# Patient Record
Sex: Male | Born: 1955 | Race: White | Hispanic: No | Marital: Married | Smoking: Never smoker
Health system: Southern US, Community
[De-identification: ages and names within clinical notes are randomized; demographics above are authoritative.]

## PROBLEM LIST (undated history)

## (undated) DIAGNOSIS — E669 Obesity, unspecified: Secondary | ICD-10-CM

## (undated) DIAGNOSIS — C4490 Unspecified malignant neoplasm of skin, unspecified: Secondary | ICD-10-CM

## (undated) DIAGNOSIS — Z8719 Personal history of other diseases of the digestive system: Secondary | ICD-10-CM

## (undated) DIAGNOSIS — Z8711 Personal history of peptic ulcer disease: Secondary | ICD-10-CM

## (undated) DIAGNOSIS — G8929 Other chronic pain: Secondary | ICD-10-CM

## (undated) DIAGNOSIS — K219 Gastro-esophageal reflux disease without esophagitis: Secondary | ICD-10-CM

## (undated) DIAGNOSIS — IMO0001 Reserved for inherently not codable concepts without codable children: Secondary | ICD-10-CM

## (undated) DIAGNOSIS — I10 Essential (primary) hypertension: Secondary | ICD-10-CM

## (undated) DIAGNOSIS — G4730 Sleep apnea, unspecified: Secondary | ICD-10-CM

## (undated) DIAGNOSIS — M1990 Unspecified osteoarthritis, unspecified site: Secondary | ICD-10-CM

## (undated) DIAGNOSIS — M25579 Pain in unspecified ankle and joints of unspecified foot: Secondary | ICD-10-CM

## (undated) DIAGNOSIS — Z9889 Other specified postprocedural states: Secondary | ICD-10-CM

## (undated) HISTORY — DX: Other chronic pain: G89.29

## (undated) HISTORY — DX: Sleep apnea, unspecified: G47.30

## (undated) HISTORY — DX: Reserved for inherently not codable concepts without codable children: IMO0001

## (undated) HISTORY — DX: Personal history of other diseases of the digestive system: Z87.19

## (undated) HISTORY — PX: ABSCESS DRAINAGE: SHX1119

## (undated) HISTORY — DX: Pain in unspecified ankle and joints of unspecified foot: M25.579

## (undated) HISTORY — DX: Gastro-esophageal reflux disease without esophagitis: K21.9

## (undated) HISTORY — DX: Unspecified malignant neoplasm of skin, unspecified: C44.90

## (undated) HISTORY — DX: Obesity, unspecified: E66.9

## (undated) HISTORY — DX: Unspecified osteoarthritis, unspecified site: M19.90

## (undated) HISTORY — DX: Personal history of peptic ulcer disease: Z87.11

---

## 2005-07-12 ENCOUNTER — Ambulatory Visit: Payer: Self-pay | Admitting: Unknown Physician Specialty

## 2008-08-24 ENCOUNTER — Ambulatory Visit: Payer: Self-pay | Admitting: Internal Medicine

## 2014-01-10 ENCOUNTER — Ambulatory Visit: Payer: Self-pay | Admitting: Unknown Physician Specialty

## 2014-10-12 ENCOUNTER — Ambulatory Visit: Payer: Self-pay | Admitting: Internal Medicine

## 2014-11-22 ENCOUNTER — Ambulatory Visit: Payer: Self-pay | Admitting: Internal Medicine

## 2015-09-21 DIAGNOSIS — C4491 Basal cell carcinoma of skin, unspecified: Secondary | ICD-10-CM

## 2015-09-21 HISTORY — DX: Basal cell carcinoma of skin, unspecified: C44.91

## 2015-10-10 DIAGNOSIS — C439 Malignant melanoma of skin, unspecified: Secondary | ICD-10-CM

## 2015-10-10 HISTORY — DX: Malignant melanoma of skin, unspecified: C43.9

## 2015-11-03 DIAGNOSIS — Z8042 Family history of malignant neoplasm of prostate: Secondary | ICD-10-CM | POA: Insufficient documentation

## 2015-11-21 ENCOUNTER — Ambulatory Visit: Payer: Self-pay | Admitting: Obstetrics and Gynecology

## 2015-11-30 ENCOUNTER — Encounter: Payer: Self-pay | Admitting: *Deleted

## 2015-11-30 DIAGNOSIS — M25579 Pain in unspecified ankle and joints of unspecified foot: Secondary | ICD-10-CM | POA: Insufficient documentation

## 2015-11-30 DIAGNOSIS — M1990 Unspecified osteoarthritis, unspecified site: Secondary | ICD-10-CM | POA: Insufficient documentation

## 2015-11-30 DIAGNOSIS — M25569 Pain in unspecified knee: Secondary | ICD-10-CM | POA: Insufficient documentation

## 2015-11-30 DIAGNOSIS — Z800 Family history of malignant neoplasm of digestive organs: Secondary | ICD-10-CM | POA: Insufficient documentation

## 2015-12-01 ENCOUNTER — Encounter: Payer: Self-pay | Admitting: Obstetrics and Gynecology

## 2015-12-01 ENCOUNTER — Ambulatory Visit (INDEPENDENT_AMBULATORY_CARE_PROVIDER_SITE_OTHER): Payer: BC Managed Care – PPO | Admitting: Obstetrics and Gynecology

## 2015-12-01 VITALS — BP 155/94 | HR 71 | Resp 16 | Ht 66.0 in | Wt 199.1 lb

## 2015-12-01 DIAGNOSIS — E291 Testicular hypofunction: Secondary | ICD-10-CM

## 2015-12-01 DIAGNOSIS — R5383 Other fatigue: Secondary | ICD-10-CM | POA: Diagnosis not present

## 2015-12-01 DIAGNOSIS — R7989 Other specified abnormal findings of blood chemistry: Secondary | ICD-10-CM

## 2015-12-01 NOTE — Progress Notes (Signed)
12/01/2015 4:05 PM   Shawn Melton Apr 05, 1956 [id number]  Referring provider: No referring provider defined for this encounter.  Chief Complaint  Patient presents with  . Hypogonadism  . Establish Care    HPI: Patient is a 59-year-old Caucasian male presenting today as a referral from his primary care provider with complaints of low serum testosterone level. Symptoms include fatigue, irritability and weight gain of approximately 10 lbs in the last year. He states that he has been under increasing stress over the last year. He recently on a new house in as having to relocate as well as continued long distances for work. His testosterone drawn last year was within normal limits at 465.   No decrease in testicular size.  No headaches or gynecomastia.  No new medications.    Testosterone 09/30/14 T 465 11/03/15   T 307 drawn @ 1031  11/03/15 TSH 1.288  CBC WNL  PSA History: 11/03/15    PSA 2.55 09/30/14  PSA 3.52  PMH: Past Medical History  Diagnosis Date  . Chronic ankle pain   . DJD (degenerative joint disease)   . Obesity   . Skin cancer   . Reflux   . Sleep apnea   . History of stomach ulcers     Surgical History: Past Surgical History  Procedure Laterality Date  . Abscess drainage      Home Medications:    Medication List       This list is accurate as of: 12/01/15  4:05 PM.  Always use your most recent med list.               acyclovir 400 MG tablet  Commonly known as:  ZOVIRAX  Reported on 12/01/2015     ALPRAZolam 0.5 MG tablet  Commonly known as:  XANAX  Take by mouth. Reported on 12/01/2015     DENAVIR 1 % cream  Generic drug:  penciclovir  Reported on 12/01/2015     omeprazole 40 MG capsule  Commonly known as:  PRILOSEC  TAKE ONE CAPSULE BY MOUTH EVERY DAY     ONE-A-DAY MENS 50+ ADVANTAGE Tabs  Take by mouth.        Allergies: No Known Allergies  Family History: Family History  Problem Relation Age of Onset  . Hypertension Mother    . Diabetes Father   . Stroke Father   . Colon cancer Paternal Uncle   . Prostate cancer Cousin     Social History:  reports that he has never smoked. He does not have any smokeless tobacco history on file. He reports that he does not drink alcohol or use illicit drugs.  ROS: UROLOGY Frequent Urination?: No Hard to postpone urination?: No Burning/pain with urination?: No Get up at night to urinate?: No Leakage of urine?: No Urine stream starts and stops?: No Trouble starting stream?: No Do you have to strain to urinate?: No Blood in urine?: No Urinary tract infection?: No Sexually transmitted disease?: No Injury to kidneys or bladder?: No Painful intercourse?: No Weak stream?: No Erection problems?: No Penile pain?: No  Gastrointestinal Nausea?: No Vomiting?: No Indigestion/heartburn?: No Diarrhea?: No Constipation?: No  Constitutional Fever: No Night sweats?: No Weight loss?: No Fatigue?: Yes  Skin Skin rash/lesions?: No Itching?: No  Eyes Blurred vision?: No Double vision?: No  Ears/Nose/Throat Sore throat?: No Sinus problems?: No  Hematologic/Lymphatic Swollen glands?: No Easy bruising?: No  Cardiovascular Leg swelling?: No Chest pain?: No  Respiratory Cough?: No Shortness of breath?: No  Endocrine Excessive thirst?: No  Musculoskeletal Back pain?: No Joint pain?: Yes  Neurological Headaches?: No Dizziness?: No  Psychologic Depression?: No Anxiety?: No  Physical Exam: BP 155/94 mmHg  Pulse 71  Resp 16  Ht 5\' 6"  (1.676 m)  Wt 199 lb 1.6 oz (90.311 kg)  BMI 32.15 kg/m2  Constitutional:  Alert and oriented, No acute distress. HEENT: [place] AT, moist mucus membranes.  Trachea midline, no masses. Cardiovascular: No clubbing, cyanosis, or edema. Respiratory: Normal respiratory effort, no increased work of breathing. GI: Abdomen is soft, nontender, nondistended, no abdominal masses GU: No CVA tenderness.  Normal circumcised phallus  without lesions. Testicles descended bilaterally without palpable masses or tenderness DRE: Prostate normal in size, smooth, no nodularity, nontender Skin: No rashes, bruises or suspicious lesions. Lymph: No cervical or inguinal adenopathy. Neurologic: Grossly intact, no focal deficits, moving all 4 extremities. Psychiatric: Normal mood and affect.  Laboratory Data:   Assessment & Plan:    1. Fatigue- Will obtain 2 additional early a.m. testosterone levels for further evaluation. We will also obtain a Vitamin D level.  RTC in 3 weeks to review results. -Testosterone x2 early am - Vitamin D -LH/FSH -prolactin  2. Low Serum testosterone-   There are no diagnoses linked to this encounter.  Return for 1 week early am lab draw; 2 week early am lab draw; f/u with me in 3 weeks to review labs.  These notes generated with voice recognition software. I apologize for typographical errors.  Herbert Moors, [facility] [address] [place], [place] [address] [phone]

## 2015-12-07 ENCOUNTER — Other Ambulatory Visit: Payer: BC Managed Care – PPO

## 2015-12-07 DIAGNOSIS — R7989 Other specified abnormal findings of blood chemistry: Secondary | ICD-10-CM

## 2015-12-07 DIAGNOSIS — R5383 Other fatigue: Secondary | ICD-10-CM

## 2015-12-07 NOTE — Progress Notes (Signed)
Lab tech was not able to draw enough blood to run all 4 test. Therefore only testosterone and FSH/LH were sent today. New orders for vitamin D and prolactin were put back in.

## 2015-12-08 LAB — FSH/LH
FSH: 4.9 m[IU]/mL (ref 1.5–12.4)
LH: 4.4 m[IU]/mL (ref 1.7–8.6)

## 2015-12-08 LAB — TESTOSTERONE: Testosterone: 236 ng/dL — ABNORMAL LOW (ref 348–1197)

## 2015-12-15 ENCOUNTER — Other Ambulatory Visit: Payer: BC Managed Care – PPO

## 2015-12-15 DIAGNOSIS — R5383 Other fatigue: Secondary | ICD-10-CM

## 2015-12-16 LAB — PROLACTIN: Prolactin: 20.1 ng/mL — ABNORMAL HIGH (ref 4.0–15.2)

## 2015-12-16 LAB — VITAMIN D 25 HYDROXY (VIT D DEFICIENCY, FRACTURES): Vit D, 25-Hydroxy: 46.6 ng/mL (ref 30.0–100.0)

## 2015-12-22 ENCOUNTER — Ambulatory Visit (INDEPENDENT_AMBULATORY_CARE_PROVIDER_SITE_OTHER): Payer: BC Managed Care – PPO | Admitting: Obstetrics and Gynecology

## 2015-12-22 ENCOUNTER — Encounter: Payer: Self-pay | Admitting: Obstetrics and Gynecology

## 2015-12-22 VITALS — BP 141/91 | HR 68 | Resp 16 | Ht 66.0 in | Wt 201.9 lb

## 2015-12-22 DIAGNOSIS — R7989 Other specified abnormal findings of blood chemistry: Secondary | ICD-10-CM

## 2015-12-22 DIAGNOSIS — R5383 Other fatigue: Secondary | ICD-10-CM

## 2015-12-22 DIAGNOSIS — E291 Testicular hypofunction: Secondary | ICD-10-CM

## 2015-12-22 DIAGNOSIS — E229 Hyperfunction of pituitary gland, unspecified: Secondary | ICD-10-CM

## 2015-12-22 NOTE — Progress Notes (Signed)
1:46 PM   Shawn Melton 1956-09-20 [id number]  Referring provider: Madelyn Brunner, MD [facility] [place], [place] [address]  Chief Complaint  Patient presents with  . Hypogonadism    HPI: Patient is a 60-year-old Caucasian male presenting today as a referral from his primary care provider with complaints of low serum testosterone level. Symptoms include fatigue, irritability and weight gain of approximately 10 lbs in the last year. He states that he has been under increasing stress over the last year. He recently on a new house in as having to relocate as well as continued long distances for work. His testosterone drawn last year was within normal limits at 465.   No decrease in testicular size.  No headaches or gynecomastia.  No new medications.    Testosterone 09/30/14 T 465 11/03/15   T 307 drawn @ 1031  11/03/15 TSH 1.288  CBC WNL  PSA History: 11/03/15    PSA 2.55 09/30/14  PSA 3.52  Current Status:  PMH: Past Medical History  Diagnosis Date  . Chronic ankle pain   . DJD (degenerative joint disease)   . Obesity   . Skin cancer   . Reflux   . Sleep apnea   . History of stomach ulcers     Surgical History: Past Surgical History  Procedure Laterality Date  . Abscess drainage      Home Medications:    Medication List       This list is accurate as of: 12/22/15  1:46 PM.  Always use your most recent med list.               acyclovir 400 MG tablet  Commonly known as:  ZOVIRAX  Reported on 12/01/2015     DENAVIR 1 % cream  Generic drug:  penciclovir  Reported on 12/01/2015     omeprazole 40 MG capsule  Commonly known as:  PRILOSEC  TAKE ONE CAPSULE BY MOUTH EVERY DAY     ONE-A-DAY MENS 50+ ADVANTAGE Tabs  Take by mouth.        Allergies: No Known Allergies  Family History: Family History  Problem Relation Age of Onset  . Hypertension Mother   . Diabetes Father   . Stroke Father   . Colon cancer Paternal  Uncle   . Prostate cancer Cousin     Social History:  reports that he has never smoked. He does not have any smokeless tobacco history on file. He reports that he does not drink alcohol or use illicit drugs.  ROS: UROLOGY Frequent Urination?: No Hard to postpone urination?: No Burning/pain with urination?: No Get up at night to urinate?: No Leakage of urine?: No Urine stream starts and stops?: No Trouble starting stream?: No Do you have to strain to urinate?: No Blood in urine?: No Urinary tract infection?: No Sexually transmitted disease?: No Injury to kidneys or bladder?: No Painful intercourse?: No Weak stream?: No Erection problems?: No Penile pain?: No  Gastrointestinal Nausea?: No Vomiting?: No Indigestion/heartburn?: No Diarrhea?: No Constipation?: No  Constitutional Fever: No Night sweats?: No Weight loss?: No Fatigue?: No  Skin Skin rash/lesions?: No Itching?: No  Eyes Blurred vision?: No Double vision?: No  Ears/Nose/Throat Sore throat?: No Sinus problems?: No  Hematologic/Lymphatic Swollen glands?: No Easy bruising?: No  Cardiovascular Leg swelling?: No Chest pain?: No  Respiratory Cough?: No Shortness of breath?: No  Endocrine Excessive thirst?: No  Musculoskeletal Back pain?: No Joint pain?: No  Neurological Headaches?:  No Dizziness?: No  Psychologic Depression?: No Anxiety?: No  Physical Exam: BP 141/91 mmHg  Pulse 68  Resp 16  Ht 5\' 6"  (1.676 m)  Wt 201 lb 14.4 oz (91.581 kg)  BMI 32.60 kg/m2  Constitutional:  Alert and oriented, No acute distress. HEENT: [place] AT, moist mucus membranes.  Trachea midline, no masses. Cardiovascular: No clubbing, cyanosis, or edema. Respiratory: Normal respiratory effort, no increased work of breathing. Skin: No rashes, bruises or suspicious lesions. Neurologic: Grossly intact, no focal deficits, moving all 4 extremities. Psychiatric: Normal mood and affect.  Laboratory  Data: Results for orders placed or performed in visit on 12/15/15  Prolactin  Result Value Ref Range   Prolactin 20.1 (H) 4.0 - 15.2 ng/mL  Vit D  25 hydroxy (rtn osteoporosis monitoring)  Result Value Ref Range   Vit D, 25-Hydroxy 46.6 30.0 - 100.0 ng/mL    Assessment & Plan:    1. Fatigue- testosterone level low 1. Vitamin D low normal. FSH and LH normal. Prolactin was slightly elevated. -Testosterone 236 - Vitamin D- 46.6- Recommended OTC supplements. -LH/FSH- 4.4/4.9 WNL -prolactin 20.1  2. Low Serum testosterone-  With elevated prolactin and normal LH/FSH. Refer to Endocrinology for further workup. We will consider testosterone replacement in the future pending completion of endocrinology workup.  3. Elevated Prolactin level- see above   There are no diagnoses linked to this encounter.  Return for patient will call for f/u appt after he sees endocrinologist .  These notes generated with voice recognition software. I apologize for typographical errors.  Herbert Moors, [facility] [address], [place], [place] [address] [phone]

## 2016-01-05 DIAGNOSIS — Z8719 Personal history of other diseases of the digestive system: Secondary | ICD-10-CM | POA: Insufficient documentation

## 2016-01-05 DIAGNOSIS — R195 Other fecal abnormalities: Secondary | ICD-10-CM | POA: Insufficient documentation

## 2016-01-05 DIAGNOSIS — K625 Hemorrhage of anus and rectum: Secondary | ICD-10-CM | POA: Insufficient documentation

## 2017-07-07 DIAGNOSIS — Z85828 Personal history of other malignant neoplasm of skin: Secondary | ICD-10-CM | POA: Insufficient documentation

## 2017-10-02 ENCOUNTER — Encounter: Payer: Self-pay | Admitting: Internal Medicine

## 2017-12-10 DIAGNOSIS — K219 Gastro-esophageal reflux disease without esophagitis: Secondary | ICD-10-CM | POA: Insufficient documentation

## 2018-07-24 ENCOUNTER — Encounter: Payer: Self-pay | Admitting: Urology

## 2018-07-24 ENCOUNTER — Ambulatory Visit: Payer: BC Managed Care – PPO | Admitting: Urology

## 2018-07-24 VITALS — BP 181/95 | HR 88 | Ht 66.0 in | Wt 195.0 lb

## 2018-07-24 DIAGNOSIS — R972 Elevated prostate specific antigen [PSA]: Secondary | ICD-10-CM | POA: Diagnosis not present

## 2018-07-24 LAB — URINALYSIS, COMPLETE
Bilirubin, UA: NEGATIVE
Glucose, UA: NEGATIVE
Ketones, UA: NEGATIVE
Leukocytes, UA: NEGATIVE
Nitrite, UA: NEGATIVE
Protein, UA: NEGATIVE
RBC, UA: NEGATIVE
Specific Gravity, UA: 1.025 (ref 1.005–1.030)
Urobilinogen, Ur: 0.2 mg/dL (ref 0.2–1.0)
pH, UA: 6 (ref 5.0–7.5)

## 2018-07-24 NOTE — Patient Instructions (Signed)

## 2018-07-24 NOTE — Progress Notes (Signed)
07/24/2018 1:29 PM   Shawn Melton [birth year] [id number]  Referring provider: Madelyn Brunner, MD No address on file  Chief Complaint  Patient presents with  . Elevated PSA    HPI: 62 year old male previously known to our practice for evaluation of low energy who returns today for further evaluation of elevated and rising PSA.  He has not been seen in our practice since early 2017.    He had annual PSAs by his primary care physician.  More recently, his PSA is risen up to 6.25 as of 06/08/2018.  PSA trend below.  PSA trend:  3.52 09/30/2014 2.55 11/03/2015 4.55 12/11/2017 6.25  06/08/2018  He has does have occasional urgency/ frequency but otherwise few urinary symptoms.  No dysuria or gross hematuria.    Cousin with prostate cancer, dx at 86.  No other close relatives.    He denies ED or sexual symptoms.    IPSS    Row Name 07/24/18 0900         International Prostate Symptom Score   How often have you had the sensation of not emptying your bladder?  Not at All     How often have you had to urinate less than every two hours?  Less than half the time     How often have you found you stopped and started again several times when you urinated?  Not at All     How often have you found it difficult to postpone urination?  Not at All     How often have you had a weak urinary stream?  Less than 1 in 5 times     How often have you had to strain to start urination?  Not at All     How many times did you typically get up at night to urinate?  None     Total IPSS Score  3       Quality of Life due to urinary symptoms   If you were to spend the rest of your life with your urinary condition just the way it is now how would you feel about that?  Mixed        Score:  1-7 Mild 8-19 Moderate 20-35 Severe   PMH: Past Medical History:  Diagnosis Date  . Chronic ankle pain   . DJD (degenerative joint disease)   . History of stomach ulcers   . Obesity   . Reflux   . Skin  cancer   . Sleep apnea     Surgical History: Past Surgical History:  Procedure Laterality Date  . ABSCESS DRAINAGE      Home Medications:  Allergies as of 07/24/2018   No Known Allergies     Medication List        Accurate as of 07/24/18  1:29 PM. Always use your most recent med list.          acyclovir 400 MG tablet Commonly known as:  ZOVIRAX Reported on 12/01/2015   ALPRAZolam 0.5 MG tablet Commonly known as:  XANAX TAKE 1 TABLET BY MOUTH NIGHTLY AS NEEDED FOR SLEEP   DENAVIR 1 % cream Generic drug:  penciclovir Reported on 12/01/2015   omeprazole 40 MG capsule Commonly known as:  PRILOSEC TAKE ONE CAPSULE BY MOUTH EVERY DAY   ONE-A-DAY MENS 50+ ADVANTAGE Tabs Take by mouth.       Allergies: No Known Allergies  Family History: Family History  Problem Relation Age of Onset  . Diabetes  Father   . Stroke Father   . Hypertension Mother   . Colon cancer Paternal Uncle   . Prostate cancer Cousin     Social History:  reports that he has never smoked. He has never used smokeless tobacco. He reports that he does not drink alcohol or use drugs.  ROS: UROLOGY Frequent Urination?: No Hard to postpone urination?: No Burning/pain with urination?: No Get up at night to urinate?: No Leakage of urine?: No Urine stream starts and stops?: No Trouble starting stream?: No Do you have to strain to urinate?: No Blood in urine?: No Urinary tract infection?: No Sexually transmitted disease?: No Injury to kidneys or bladder?: No Painful intercourse?: No Weak stream?: No Erection problems?: No Penile pain?: No  Gastrointestinal Nausea?: No Vomiting?: No Indigestion/heartburn?: No Diarrhea?: No Constipation?: No  Constitutional Fever: No Night sweats?: No Weight loss?: No Fatigue?: No  Skin Skin rash/lesions?: No Itching?: No  Eyes Blurred vision?: No Double vision?: No  Ears/Nose/Throat Sore throat?: No Sinus problems?:  No  Hematologic/Lymphatic Swollen glands?: No Easy bruising?: No  Cardiovascular Leg swelling?: No Chest pain?: No  Respiratory Cough?: No Shortness of breath?: No  Endocrine Excessive thirst?: No  Musculoskeletal Back pain?: No Joint pain?: No  Neurological Headaches?: No Dizziness?: No  Psychologic Depression?: No Anxiety?: No  Physical Exam: BP (!) 181/95   Pulse 88   Ht 5\' 6"  (1.676 m)   Wt 195 lb (88.5 kg)   BMI 31.47 kg/m   Constitutional:  Alert and oriented, No acute distress. HEENT: [place] AT, moist mucus membranes.  Trachea midline, no masses. Cardiovascular: No clubbing, cyanosis, or edema. Respiratory: Normal respiratory effort, no increased work of breathing. GI: Abdomen is soft, nontender, nondistended, no abdominal masses GU: No CVA tenderness Rectal exam: Normal sphincter tone.  40 cc prostate, nontender, no nodules. Skin: No rashes, bruises or suspicious lesions. Neurologic: Grossly intact, no focal deficits, moving all 4 extremities. Psychiatric: Normal mood and affect.  Laboratory Data:  Lab Results  Component Value Date   TESTOSTERONE 236 (L) 12/07/2015   PSA as above   Urinalysis Results for orders placed or performed in visit on 07/24/18  Urinalysis, Complete  Result Value Ref Range   Specific Gravity, UA 1.025 1.005 - 1.030   pH, UA 6.0 5.0 - 7.5   Color, UA Yellow Yellow   Appearance Ur Clear Clear   Leukocytes, UA Negative Negative   Protein, UA Negative Negative/Trace   Glucose, UA Negative Negative   Ketones, UA Negative Negative   RBC, UA Negative Negative   Bilirubin, UA Negative Negative   Urobilinogen, Ur 0.2 0.2 - 1.0 mg/dL   Nitrite, UA Negative Negative    Pertinent Imaging: N/A  Assessment & Plan:    1. Elevated PSA  We reviewed the implications of an elevated PSA and the uncertainty surrounding it. In general, a man's PSA increases with age and is produced by both normal and cancerous prostate tissue.  Differential for elevated PSA is BPH, prostate cancer, infection, recent intercourse/ejaculation, prostate infarction, recent urethroscopic manipulation (foley placement/cystoscopy) and prostatitis. Management of an elevated PSA can include observation or prostate biopsy and wediscussed this in detail. We discussed that indications for prostate biopsy are defined by age and race specific PSA cutoffs as well as a PSA velocity of 0.75/year.  Overall, his PSA is elevated and upward trending which is concerning.  Rectal exam unremarkable today.  Plan to repeat PSA today.  We discussed that if his PSA remains elevated  or is higher, we will recommend pursuing prostate biopsy.  We discussed prostate biopsy in detail including the procedure itself, the risks of blood in the urine, stool, and ejaculate, serious infection, and discomfort. He is willing to proceed with this as discussed.  - PSA   Return in about 4 weeks (around 08/21/2018) for prostate biopsy.  Hollice Espy, MD  [facility] [address], [place], [place] [address] [phone]

## 2018-07-25 LAB — PSA: Prostate Specific Ag, Serum: 4.8 ng/mL — ABNORMAL HIGH (ref 0.0–4.0)

## 2018-07-27 ENCOUNTER — Telehealth: Payer: Self-pay | Admitting: Urology

## 2018-07-27 NOTE — Telephone Encounter (Signed)
-----   Message from Hollice Espy, MD sent at 07/25/2018  3:06 PM EDT ----- PSA is actually back down to 4.8.  This is reassuring.  Lets arrange for follow-up in 6 months with PSA again and continue to follow you closely.  I recommend holding off on biopsy for the time being.  Hollice Espy, MD

## 2018-07-27 NOTE — Telephone Encounter (Signed)
Biopsy has been canceled and he has been scheduled for his 6 month appts.  Sharyn Lull

## 2018-08-13 ENCOUNTER — Other Ambulatory Visit: Payer: BC Managed Care – PPO | Admitting: Urology

## 2018-08-27 ENCOUNTER — Ambulatory Visit: Payer: BC Managed Care – PPO | Admitting: Urology

## 2018-12-02 DIAGNOSIS — C61 Malignant neoplasm of prostate: Secondary | ICD-10-CM

## 2018-12-02 HISTORY — DX: Malignant neoplasm of prostate: C61

## 2019-01-21 NOTE — Progress Notes (Signed)
01/27/2019 8:52 AM   Shawn Melton 10-Jun-1956 [id number]  Referring provider: Baxter Hire, MD [place], [place] [address]  Chief Complaint  Patient presents with  . Elevated PSA    6 month follow up    HPI: Shawn Melton is a 63 yo M who returns today for the evaluation and management of elevated and rising PSA. He was last seen by [place] on 07/24/2018.  He returns today with six-month follow-up PSA.  More recently, his PSA is risen up to 7.10 as of 01/25/2019.  PSA trend below.  PSA trend:  3.52 09/30/2014 2.55 11/03/2015 4.55 12/11/2017 6.25  06/08/2018 --> considered biopsy but canceled when repeat PSA trended down 4.80  07/24/2018  7.10  01/25/2019   He does not report of any bothersome urinary symptoms.  He denies use of aspirin. Sometimes takes ibuprofen.   Cousin with prostate cancer, dx at 37.  No other close relatives.    PMH: Past Medical History:  Diagnosis Date  . Chronic ankle pain   . DJD (degenerative joint disease)   . History of stomach ulcers   . Obesity   . Reflux   . Skin cancer   . Sleep apnea     Surgical History: Past Surgical History:  Procedure Laterality Date  . ABSCESS DRAINAGE      Home Medications:  Allergies as of 01/27/2019   No Known Allergies     Medication List       Accurate as of January 27, 2019  8:52 AM. Always use your most recent med list.        acyclovir 400 MG tablet Commonly known as:  ZOVIRAX Reported on 12/01/2015   ALPRAZolam 0.5 MG tablet Commonly known as:  XANAX TAKE 1 TABLET BY MOUTH NIGHTLY AS NEEDED FOR SLEEP   DENAVIR 1 % cream Generic drug:  penciclovir Reported on 12/01/2015   losartan 25 MG tablet Commonly known as:  COZAAR   omeprazole 40 MG capsule Commonly known as:  PRILOSEC TAKE ONE CAPSULE BY MOUTH EVERY DAY   ONE-A-DAY MENS 50+ ADVANTAGE Tabs Take by mouth.       Allergies: No Known Allergies  Family History: Family History  Problem Relation Age of  Onset  . Diabetes Father   . Stroke Father   . Hypertension Mother   . Colon cancer Paternal Uncle   . Prostate cancer Cousin     Social History:  reports that he has never smoked. He has never used smokeless tobacco. He reports that he does not drink alcohol or use drugs.  ROS: UROLOGY Frequent Urination?: No Hard to postpone urination?: No Burning/pain with urination?: No Get up at night to urinate?: No Leakage of urine?: No Urine stream starts and stops?: No Trouble starting stream?: No Do you have to strain to urinate?: No Blood in urine?: No Urinary tract infection?: No Sexually transmitted disease?: No Injury to kidneys or bladder?: No Painful intercourse?: No Weak stream?: No Erection problems?: No Penile pain?: No  Gastrointestinal Nausea?: No Vomiting?: No Indigestion/heartburn?: No Diarrhea?: No Constipation?: No  Constitutional Fever: No Night sweats?: No Weight loss?: No Fatigue?: No  Skin Skin rash/lesions?: No Itching?: No  Eyes Blurred vision?: No Double vision?: No  Ears/Nose/Throat Sore throat?: No Sinus problems?: No  Hematologic/Lymphatic Swollen glands?: No Easy bruising?: No  Cardiovascular Leg swelling?: No Chest pain?: No  Respiratory Cough?: No Shortness of breath?: No  Endocrine Excessive thirst?: No  Musculoskeletal Back pain?: No  Joint pain?: No  Neurological Headaches?: No Dizziness?: No  Psychologic Depression?: No Anxiety?: No  Physical Exam: BP (!) 167/86   Pulse 80   Ht 5\' 2"  (1.575 m)   Wt 207 lb (93.9 kg)   BMI 37.86 kg/m   Constitutional:  Alert and oriented, No acute distress. HEENT: [place] AT, moist mucus membranes.  Trachea midline, no masses. Cardiovascular: No clubbing, cyanosis, or edema. Respiratory: Normal respiratory effort, no increased work of breathing. Skin: No rashes, bruises or suspicious lesions. Neurologic: Grossly intact, no focal deficits, moving all 4  extremities. Psychiatric: Normal mood and affect.  Assessment & Plan:    1. Elevated PSA  PSA from 01/25/2019 was 7.10, increased from previous PSA of 4.80  Fluctuating PSA but overall upward trend which is worrisome We reviewed the implications of an elevated PSA and the uncertainty surrounding it. In general, a man's PSA increases with age and is produced by both normal and cancerous prostate tissue. Differential for elevated PSA is BPH, prostate cancer, infection, recent intercourse/ejaculation, prostate infarction, recent urethroscopic manipulation (foley placement/cystoscopy) and prostatitis. Management of an elevated PSA can include observation or prostate biopsy and wediscussed this in detail. We discussed that indications for prostate biopsy are defined by age and race specific PSA cutoffs as well as a PSA velocity of 0.75/year. Pt is agreeable to treatment with prostate biopsy; We discussed prostate biopsy in detail including the procedure itself, the risks of blood in the urine, stool, and ejaculate, serious infection, and discomfort. He is willing to proceed with this as discussed.  2. Rising PSA See above.  Return for next available biopsy .  [facility] [address], [place], [place] [address] [phone]  I, Lucas Mallow, am acting as a scribe for Dr. Hollice Espy,  I have reviewed the above documentation for accuracy and completeness, and I agree with the above.   Hollice Espy, MD

## 2019-01-22 ENCOUNTER — Other Ambulatory Visit: Payer: Self-pay

## 2019-01-22 DIAGNOSIS — R972 Elevated prostate specific antigen [PSA]: Secondary | ICD-10-CM

## 2019-01-25 ENCOUNTER — Other Ambulatory Visit: Payer: BC Managed Care – PPO

## 2019-01-25 DIAGNOSIS — R972 Elevated prostate specific antigen [PSA]: Secondary | ICD-10-CM

## 2019-01-26 LAB — PSA: Prostate Specific Ag, Serum: 7.1 ng/mL — ABNORMAL HIGH (ref 0.0–4.0)

## 2019-01-27 ENCOUNTER — Ambulatory Visit: Payer: BC Managed Care – PPO | Admitting: Urology

## 2019-01-27 ENCOUNTER — Encounter: Payer: Self-pay | Admitting: Urology

## 2019-01-27 VITALS — BP 167/86 | HR 80 | Ht 62.0 in | Wt 207.0 lb

## 2019-01-27 DIAGNOSIS — R972 Elevated prostate specific antigen [PSA]: Secondary | ICD-10-CM

## 2019-02-24 ENCOUNTER — Other Ambulatory Visit: Payer: BC Managed Care – PPO | Admitting: Urology

## 2019-03-09 ENCOUNTER — Ambulatory Visit: Payer: BC Managed Care – PPO | Admitting: Urology

## 2019-04-07 ENCOUNTER — Other Ambulatory Visit: Payer: Self-pay | Admitting: Urology

## 2019-04-07 ENCOUNTER — Other Ambulatory Visit: Payer: Self-pay

## 2019-04-07 ENCOUNTER — Ambulatory Visit: Payer: BC Managed Care – PPO | Admitting: Urology

## 2019-04-07 ENCOUNTER — Encounter: Payer: Self-pay | Admitting: Urology

## 2019-04-07 VITALS — BP 156/104 | HR 103 | Ht 66.0 in | Wt 203.0 lb

## 2019-04-07 DIAGNOSIS — R972 Elevated prostate specific antigen [PSA]: Secondary | ICD-10-CM | POA: Diagnosis not present

## 2019-04-07 MED ORDER — LEVOFLOXACIN 500 MG PO TABS
500.0000 mg | ORAL_TABLET | Freq: Once | ORAL | Status: AC
  Administered 2019-04-07: 500 mg via ORAL

## 2019-04-07 MED ORDER — GENTAMICIN SULFATE 40 MG/ML IJ SOLN
80.0000 mg | Freq: Once | INTRAMUSCULAR | Status: AC
  Administered 2019-04-07: 11:00:00 80 mg via INTRAMUSCULAR

## 2019-04-07 NOTE — Progress Notes (Signed)
   04/07/19  CC:  Chief Complaint  Patient presents with  . Prostate Biopsy    HPI: 63 year old male who presents today for prostate biopsy in the setting of rising/elevated PSA.  Please see previous notes for details.  Blood pressure (!) 156/104, pulse (!) 103, height 5\' 6"  (1.676 m), weight 203 lb (92.1 kg). NED. A&Ox3.   No respiratory distress   Abd soft, NT, ND Normal sphincter tone  Prostate Biopsy Procedure   Informed consent was obtained after discussing risks/benefits of the procedure.  A time out was performed to ensure correct patient identity.  Pre-Procedure: - Gentamicin given prophylactically - Levaquin 500 mg administered PO -Transrectal Ultrasound performed revealing a 62.9 gm prostate -No significant hypoechoic lesions -Small median lobe with mild intravesical protrusion  Procedure: - Prostate block performed using 10 cc 1% lidocaine and biopsies taken from sextant areas, a total of 12 under ultrasound guidance.  Post-Procedure: - Patient tolerated the procedure well - He was counseled to seek immediate medical attention if experiences any severe pain, significant bleeding, or fevers - Return in one week to discuss biopsy results (virtual visit)   Hollice Espy, MD

## 2019-04-10 LAB — PATHOLOGY REPORT

## 2019-04-12 ENCOUNTER — Other Ambulatory Visit: Payer: Self-pay | Admitting: Urology

## 2019-04-12 ENCOUNTER — Telehealth: Payer: Self-pay | Admitting: Urology

## 2019-04-13 ENCOUNTER — Telehealth: Payer: Self-pay | Admitting: Urology

## 2019-04-13 DIAGNOSIS — C61 Malignant neoplasm of prostate: Secondary | ICD-10-CM

## 2019-04-13 NOTE — Telephone Encounter (Signed)
Newly diagnosed high risk prostate cancer.  Call patient to inform him.  I would like him to have a CT abdomen pelvis as well as a bone scan prior to her follow-up appointment next week.  He is aware of this.  Orders have been placed.  Can you please help expedite these imaging studies to help inform discussion next week?  Hollice Espy, MD

## 2019-04-14 NOTE — Telephone Encounter (Signed)
Insurance authorization complete for both studies.  I sent an email to Manuela Schwartz in Centralized Scheduling to expedite the scheduling and to contact the patient to arrange before his follow up appt next week.

## 2019-04-16 ENCOUNTER — Ambulatory Visit
Admission: RE | Admit: 2019-04-16 | Discharge: 2019-04-16 | Disposition: A | Discharge status: Home / Self Care | Payer: BC Managed Care – PPO | Source: Ambulatory Visit | Attending: Urology | Admitting: Urology

## 2019-04-16 ENCOUNTER — Other Ambulatory Visit: Payer: Self-pay

## 2019-04-16 ENCOUNTER — Encounter
Admission: RE | Admit: 2019-04-16 | Discharge: 2019-04-16 | Disposition: A | Discharge status: Home / Self Care | Payer: BC Managed Care – PPO | Source: Ambulatory Visit | Attending: Urology | Admitting: Urology

## 2019-04-16 DIAGNOSIS — C61 Malignant neoplasm of prostate: Secondary | ICD-10-CM

## 2019-04-16 DIAGNOSIS — I10 Essential (primary) hypertension: Secondary | ICD-10-CM | POA: Insufficient documentation

## 2019-04-16 HISTORY — DX: Essential (primary) hypertension: I10

## 2019-04-16 LAB — POCT I-STAT CREATININE: Creatinine, Ser: 1.4 mg/dL — ABNORMAL HIGH (ref 0.61–1.24)

## 2019-04-16 MED ORDER — IOHEXOL 300 MG/ML  SOLN
100.0000 mL | Freq: Once | INTRAMUSCULAR | Status: AC | PRN
  Administered 2019-04-16: 10:00:00 100 mL via INTRAVENOUS

## 2019-04-16 MED ORDER — TECHNETIUM TC 99M MEDRONATE IV KIT
21.6300 | PACK | Freq: Once | INTRAVENOUS | Status: AC | PRN
  Administered 2019-04-16: 21.63 via INTRAVENOUS

## 2019-04-20 ENCOUNTER — Telehealth (INDEPENDENT_AMBULATORY_CARE_PROVIDER_SITE_OTHER): Payer: BC Managed Care – PPO | Admitting: Urology

## 2019-04-20 ENCOUNTER — Other Ambulatory Visit: Payer: Self-pay

## 2019-04-20 DIAGNOSIS — C61 Malignant neoplasm of prostate: Secondary | ICD-10-CM | POA: Diagnosis not present

## 2019-04-20 NOTE — Progress Notes (Signed)
Virtual Visit via Video Note  I connected with Shawn Melton on 04/20/19 at 11:30 AM EDT by a video enabled telemedicine application and verified that I am speaking with the correct person using two identifiers.  We spent some portion of time on video during her visit but however due to poor connectivity, abandon this part way through 4 telephone visit.  Location: Patient: home Provider: home   I discussed the limitations of evaluation and management by telemedicine and the availability of in person appointments. The patient expressed understanding and agreed to proceed.  History of Present Illness: 63 year old male with a history of fluctuating/elevated PSA most recently up to 7.1 who underwent prostate biopsy revealing newly diagnosed high risk prostate cancer.  He returns today to discuss these results as well as interval staging with CT and bone scan.  In the setting of rising PSA, he underwent prostate biopsy on 04/12/2019.  This revealed 7 of 12 cores positive, 6 on the left and one on the right.  On the left side, he had Gleason 4+4 and 4+5 in all cores up to 26%.  On the left, had a single core of low volume Gleason 3+3.  TRUS volume 62.9 g.    No previous abdominal surgeries.  No baseline urinary symptoms.  No ED.  In the interim, he is undergone CT abdomen pelvis with contrast indicating an asymmetric enlarged right seminal vesicle without evidence of lymphadenopathy or other metastatic disease.  Bone scan was also negative.  Imaging was personally reviewed today.  Past medical/surgical/medications all personally reviewed today.   Observations/Objective: Appears well, pleasant, interactive, asking intelligent questions.  Assessment and Plan:  1. Prostate cancer [facility]) The patient was counseled about the natural history of prostate cancer and the standard treatment options that are available for prostate cancer. It was explained to him how his age and life expectancy, clinical  stage, Gleason score, and PSA affect his prognosis, the decision to proceed with additional staging studies, as well as how that information influences recommended treatment strategies. We discussed the roles for active surveillance, radiation therapy, surgical therapy, androgen deprivation, as well as ablative therapy options for the treatment of prostate cancer as appropriate to his individual cancer situation. We discussed the risks and benefits of these options with regard to their impact on cancer control and also in terms of potential adverse events, complications, and impact on quality of life particularly related to urinary, bowel, and sexual function. The patient was encouraged to ask questions throughout the discussion today and all questions were answered to his stated satisfaction. In addition, the patient was provided with and/or directed to appropriate resources and literature for further education about prostate cancer treatment options.  We discussed surgical therapy for prostate cancer including the different available surgical approaches. We discussed, in detail, the risks and expectations of surgery with regard to cancer control, urinary control, and erectile dysfunction as well as expected post operative cover he processed. Additional risks of surgery including but not limited to bleeding, infection, hernia formation, nerve damage, steel formation, bowel/rectal injury, potentially necessitating colostomy, damage to the urinary tract resulting in urinary leakage, urethral stricture, and cardiopulmonary risk such as myocardial infarction, stroke, death, thromboembolism etc. were explained. The risk of open surgical conversion for robotics/laparoscopic prostatectomy is also discussed.  Specifically today in addition to this, we discussed the findings of an enlarged right seminal vesicle.  It appears that most of his disease on the left side.  We discussed the differential diagnosis for  asymmetry  of the seminal vesicles including ejaculatory duct obstruction for cancer within the prostatic fossa, inflammation/artifact from recent biopsy, anatomic variations and or disease within the seminal vesicle itself.  He understands given the presence of his high risk disease, if he were to have disease outside of the prostate including adverse pathologic features including bladder neck invasion, seminal vesicle involvement, metastatic disease to the lymph nodes, etc. adjuvant therapies which strongly be considered including adjuvant radiation and/or ADT.  He is a higher risk for this due to the extent of his disease and CT scan findings.  Alternatively, radiation was discussed in detail with 2 to 3 years of ADT.  He would like to further this discussion with our radiation oncologist, Dr. Baruch Gouty.  He understands the oncologic outcomes are fairly similar with some advantages/disadvantages of each which was discussed extensively today.  If he did elect to pursue surgery, we plan for a wide margin, non-nerve sparing disease with bilateral pelvic lymph node dissection.  He understands all of this.  After he is seen and evaluated by Dr. Baruch Gouty, he will let us know how would like to proceed.   - Ambulatory referral to Radiation Oncology   Follow Up Instructions:    I discussed the assessment and treatment plan with the patient. The patient was provided an opportunity to ask questions and all were answered. The patient agreed with the plan and demonstrated an understanding of the instructions.   The patient was advised to call back or seek an in-person evaluation if the symptoms worsen or if the condition fails to improve as anticipated.  I provided 47 minutes of non-face-to-face time during this encounter.   Hollice Espy, MD

## 2019-04-28 ENCOUNTER — Institutional Professional Consult (permissible substitution): Payer: BC Managed Care – PPO | Admitting: Radiation Oncology

## 2019-04-28 ENCOUNTER — Other Ambulatory Visit: Payer: Self-pay

## 2019-04-29 ENCOUNTER — Ambulatory Visit
Admission: RE | Admit: 2019-04-29 | Discharge: 2019-04-29 | Disposition: A | Discharge status: Home / Self Care | Payer: BC Managed Care – PPO | Source: Ambulatory Visit | Attending: Radiation Oncology | Admitting: Radiation Oncology

## 2019-04-29 ENCOUNTER — Other Ambulatory Visit: Payer: Self-pay

## 2019-04-29 ENCOUNTER — Encounter: Payer: Self-pay | Admitting: Radiation Oncology

## 2019-04-29 ENCOUNTER — Telehealth: Payer: Self-pay | Admitting: Urology

## 2019-04-29 VITALS — BP 160/94 | HR 81 | Temp 98.0°F | Resp 18 | Wt 200.4 lb

## 2019-04-29 DIAGNOSIS — C61 Malignant neoplasm of prostate: Secondary | ICD-10-CM | POA: Diagnosis present

## 2019-04-29 DIAGNOSIS — Z85828 Personal history of other malignant neoplasm of skin: Secondary | ICD-10-CM | POA: Diagnosis not present

## 2019-04-29 DIAGNOSIS — E669 Obesity, unspecified: Secondary | ICD-10-CM | POA: Insufficient documentation

## 2019-04-29 DIAGNOSIS — G4730 Sleep apnea, unspecified: Secondary | ICD-10-CM | POA: Insufficient documentation

## 2019-04-29 DIAGNOSIS — M1990 Unspecified osteoarthritis, unspecified site: Secondary | ICD-10-CM | POA: Diagnosis not present

## 2019-04-29 DIAGNOSIS — Z800 Family history of malignant neoplasm of digestive organs: Secondary | ICD-10-CM | POA: Insufficient documentation

## 2019-04-29 DIAGNOSIS — Z79899 Other long term (current) drug therapy: Secondary | ICD-10-CM | POA: Diagnosis not present

## 2019-04-29 DIAGNOSIS — I10 Essential (primary) hypertension: Secondary | ICD-10-CM | POA: Insufficient documentation

## 2019-04-29 NOTE — Addendum Note (Signed)
Addended by: Hollice Espy on: 04/29/2019 12:59 PM   Modules accepted: Orders

## 2019-04-29 NOTE — Consult Note (Signed)
NEW PATIENT EVALUATION  Name: Shawn Melton  MRN: [id number]  Date:   04/29/2019     DOB: Nov 15, 1956   This 63 y.o. 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  REFERRING PHYSICIAN: Hollice Espy, MD  CHIEF COMPLAINT:  Chief Complaint  Patient presents with  . Prostate Cancer    DIAGNOSIS: The encounter diagnosis was Malignant neoplasm of prostate ([place]).   PREVIOUS INVESTIGATIONS:  Pathology report reviewed Bone scan and CT scan reviewed Clinical notes reviewed  HPI: Patient is a healthy 63 year old male followed for about a year with rising PSA.  He was eventually seen by urology underwent transrectal ultrasound-guided biopsy when his PSA was 7.1.  At that time 7 of 12 core biopsies were positive for adenocarcinoma up to a Gleason 9 (4+5).  His transrectal ultrasound-guided volume was 62.9 g.  Since his biopsy he has had some slight increase in frequency and urgency was asymptomatic prior to that.  Patient is undergone a bone scan showing no evidence of metastatic disease.  CT scan showed a mildly enlarged prostate with asymmetric enlargement of the right seminal vesicle suspicious for tumor invasion.  No evidence of metastatic disease or pelvic adenopathy was identified by CT criteria.  He has been seen by Dr. Erlene Quan with treatment recommendations reviewed is now seen for radiation oncology opinion.  PLANNED TREATMENT REGIMEN: Prostatectomy with possible salvage radiation therapy  PAST MEDICAL HISTORY:  has a past medical history of Chronic ankle pain, DJD (degenerative joint disease), History of stomach ulcers, Hypertension, Obesity, Reflux, Skin cancer, and Sleep apnea.    PAST SURGICAL HISTORY:  Past Surgical History:  Procedure Laterality Date  . ABSCESS DRAINAGE      FAMILY HISTORY: family history includes Colon cancer in his paternal uncle; Diabetes  in his father; Hypertension in his mother; Prostate cancer in his cousin; Stroke in his father.  SOCIAL HISTORY:  reports that he has never smoked. He has never used smokeless tobacco. He reports that he does not drink alcohol or use drugs.  ALLERGIES: Patient has no known allergies.  MEDICATIONS:  Current Outpatient Medications  Medication Sig Dispense Refill  . acyclovir (ZOVIRAX) 400 MG tablet Reported on 12/01/2015    . ALPRAZolam (XANAX) 0.5 MG tablet TAKE 1 TABLET BY MOUTH NIGHTLY AS NEEDED FOR SLEEP  2  . DENAVIR 1 % cream Reported on 12/01/2015  12  . losartan (COZAAR) 25 MG tablet     . Multiple Vitamins-Minerals (ONE-A-DAY MENS 50+ ADVANTAGE) TABS Take by mouth.    Marland Kitchen omeprazole (PRILOSEC) 40 MG capsule TAKE ONE CAPSULE BY MOUTH EVERY DAY     No current facility-administered medications for this encounter.     ECOG PERFORMANCE STATUS:  0 - Asymptomatic  REVIEW OF SYSTEMS: Patient denies any weight loss, fatigue, weakness, fever, chills or night sweats. Patient denies any loss of vision, blurred vision. Patient denies any ringing  of the ears or hearing loss. No irregular heartbeat. Patient denies heart murmur or history of fainting. Patient denies any chest pain or pain radiating to her upper extremities. Patient denies any shortness of breath, difficulty breathing at night, cough or hemoptysis. Patient denies any swelling in the lower legs. Patient denies any nausea vomiting, vomiting of blood, or coffee ground material in the vomitus. Patient denies any stomach pain. Patient states has had normal bowel movements no significant constipation or diarrhea. Patient  denies any dysuria, hematuria or significant nocturia. Patient denies any problems walking, swelling in the joints or loss of balance. Patient denies any skin changes, loss of hair or loss of weight. Patient denies any excessive worrying or anxiety or significant depression. Patient denies any problems with insomnia. Patient  denies excessive thirst, polyuria, polydipsia. Patient denies any swollen glands, patient denies easy bruising or easy bleeding. Patient denies any recent infections, allergies or URI. Patient "s visual fields have not changed significantly in recent time.   PHYSICAL EXAM: BP (!) 160/94   Pulse 81   Temp 98 F (36.7 C)   Resp 18   Wt 200 lb 6.4 oz (90.9 kg)   BMI 32.35 kg/m  On rectal exam rectal sphincter tone is good prostate is smooth without evidence of nodularity or mass.  Sulcus is preserved bilaterally.  Rectal exam is otherwise unremarkable.  Well-developed well-nourished patient in NAD. HEENT reveals PERLA, EOMI, discs not visualized.  Oral cavity is clear. No oral mucosal lesions are identified. Neck is clear without evidence of cervical or supraclavicular adenopathy. Lungs are clear to A&P. Cardiac examination is essentially unremarkable with regular rate and rhythm without murmur rub or thrill. Abdomen is benign with no organomegaly or masses noted. Motor sensory and DTR levels are equal and symmetric in the upper and lower extremities. Cranial nerves II through XII are grossly intact. Proprioception is intact. No peripheral adenopathy or edema is identified. No motor or sensory levels are noted. Crude visual fields are within normal range.  LABORATORY DATA: Pathology reports reviewed    RADIOLOGY RESULTS: CT scans and bone scan reviewed and compatible with above-stated findings   IMPRESSION: [place] stage III clinically (T3b N0 M0) up to a Gleason 9 (4+5) adenocarcinoma the prostate in 63 year old male  PLAN: At this time of gone over treatment recommendations.  I believe it comes down to prostatectomy followed by salvage radiation therapy should he have positive margins seminal vesicle involvement or postoperative rise in his PSA.  Other avenue of treatment would be IMRT radiation therapy to prostate and pelvic nodes plus androgen debate deprivation therapy plus boost to his prostate  using I-125 interstitial implant.  Risks and benefits of all types of procedures including erectile dysfunction, incontinence fatigue risks and benefits of general surgery radiation safety precautions should he choose implant all were discussed in detail with the patient.  Being that the patient is young and an excellent age I believe radical prostatectomy with the potential for salvage radiation therapy would be my first choice of treatment.  Patient is leaning in that direction.  He will arrange a follow-up appointment with Dr. Erlene Quan to discuss that option.  Patient comprehends my treatment plan well.  I would like to take this opportunity to thank you for allowing me to participate in the care of your patient.Noreene Filbert, MD

## 2019-04-29 NOTE — Telephone Encounter (Signed)
Can you reach out to him and find you what his questions are specifically?    Also, can you please fill out a booking sheet as best as possible (RALP + BPNLD)    Referral for PT placed.  Hollice Espy, MD

## 2019-04-29 NOTE — Telephone Encounter (Signed)
Pt called office to let Dr Erlene Quan know he had consult with Dr Huey Bienenstock and would like to go the surgery route.  He also has a few questions for Dr Erlene Quan.

## 2019-05-04 ENCOUNTER — Other Ambulatory Visit: Payer: Self-pay | Admitting: Radiology

## 2019-05-04 ENCOUNTER — Telehealth: Payer: Self-pay | Admitting: Radiology

## 2019-05-04 DIAGNOSIS — C61 Malignant neoplasm of prostate: Secondary | ICD-10-CM

## 2019-05-04 NOTE — Telephone Encounter (Signed)
discussed the [facility] Information form below with patient over the phone.   [address], [place] [place], [place] [address] Telephone: [phone] Fax: [phone]   Thank you for choosing [facility] for your upcoming surgery!  We are always here to assist in your urological needs.  Please read the following information with specific details for your upcoming appointments related to your surgery. Please contact Evany Schecter at [phone] Option 3 with any questions.  The Name of Your Surgery: Robot assisted laparoscopic radical prostatectomy with bilateral pelvic lymph node dissection Your Surgery Date: 05/31/2019 Your Surgeon: Hollice Espy  Please call Same Day Surgery at [phone] between the hours of 1pm-3pm one day prior to your surgery. They will inform you of the time to arrive at Same Day Surgery which is located on the second floor of the [facility].   Please refer to the attached letter regarding instructions for Pre-Admission Testing. You will receive a call from the [facility] office regarding your appointment with them.  The Pre-Admission Testing office is located at [place], on the first floor of the [place] at [facility] in [place] (office is to the right as you enter through the [employer] of the [facility]). Please have all medications you are currently taking and your insurance card available.   Patient was advised to have nothing to eat or drink after midnight the night prior to surgery except that he may have only water until 2 hours before surgery with nothing to drink within 2 hours of surgery.  The patient states he currently takes no blood thinners. Patient's questions were answered and he expressed understanding of these instructions.

## 2019-05-21 ENCOUNTER — Other Ambulatory Visit: Payer: Self-pay | Admitting: Family Medicine

## 2019-05-21 DIAGNOSIS — C61 Malignant neoplasm of prostate: Secondary | ICD-10-CM

## 2019-05-24 ENCOUNTER — Other Ambulatory Visit: Payer: Self-pay

## 2019-05-24 ENCOUNTER — Other Ambulatory Visit: Payer: BC Managed Care – PPO

## 2019-05-24 DIAGNOSIS — C61 Malignant neoplasm of prostate: Secondary | ICD-10-CM

## 2019-05-24 LAB — URINALYSIS, COMPLETE
Bilirubin, UA: NEGATIVE
Glucose, UA: NEGATIVE
Ketones, UA: NEGATIVE
Leukocytes,UA: NEGATIVE
Nitrite, UA: NEGATIVE
Protein,UA: NEGATIVE
RBC, UA: NEGATIVE
Specific Gravity, UA: 1.015 (ref 1.005–1.030)
Urobilinogen, Ur: 0.2 mg/dL (ref 0.2–1.0)
pH, UA: 5.5 (ref 5.0–7.5)

## 2019-05-24 LAB — MICROSCOPIC EXAMINATION
Bacteria, UA: NONE SEEN
Epithelial Cells (non renal): NONE SEEN /hpf (ref 0–10)
RBC, Urine: NONE SEEN /hpf (ref 0–2)

## 2019-05-26 ENCOUNTER — Encounter: Payer: Self-pay | Admitting: Physical Therapy

## 2019-05-26 ENCOUNTER — Ambulatory Visit: Payer: BC Managed Care – PPO | Attending: Urology | Admitting: Physical Therapy

## 2019-05-26 ENCOUNTER — Other Ambulatory Visit: Payer: Self-pay

## 2019-05-26 DIAGNOSIS — R293 Abnormal posture: Secondary | ICD-10-CM

## 2019-05-26 DIAGNOSIS — M62838 Other muscle spasm: Secondary | ICD-10-CM | POA: Insufficient documentation

## 2019-05-26 DIAGNOSIS — M6208 Separation of muscle (nontraumatic), other site: Secondary | ICD-10-CM | POA: Insufficient documentation

## 2019-05-26 LAB — CULTURE, URINE COMPREHENSIVE

## 2019-05-26 NOTE — Patient Instructions (Addendum)
  Decrease downward forces onto pelvic floor: 1) Log roll out of bed instead of jumping out of bed with a crunch  Avoid straining pelvic floor, abdominal muscles , spine  Use log rolling technique instead of getting out of bed with your neck or the sit-up     Log rolling into and out of bed   Log rolling into and out of bed If getting out of bed on R side, Bent knees, scoot hips/ shoulder to L  Raise R arm completely overhead, rolling onto armpit  Then lower bent knees to bed to get into complete side lying position  Then drop legs off bed, and push up onto R elbow/forearm, and use L hand to push onto the bed     2) a Proper body mechanics with sitting with less strain  On pelvic floor  Feet on the ground, knees above ankles. Sitting on sitting bones  2) b Proper body mechanics with getting out of a chair to decrease strain  on back &pelvic floor   Avoid holding your breath when Getting out of the chair:  Scoot to front part of chair chair Heels behind feet, feet are hip width apart, nose over toes  Inhale like you are smelling roses Exhale to stand    3) Exhale as you lift or as you get out of the chair (against gravity and loads)  _______   This week prior to surgery and after catheter is removed:  Open book ( handout) 15 rep Morning and night   Deep core level (1) ( handout)  Morning and afternoon and evening     ____  While catheter is in:  deep core level 1 (breathing 10 reps)   no pelvic floor squeezes   Continue with walking / rest   ___

## 2019-05-26 NOTE — Therapy (Signed)
[place] MAIN [facility] SERVICES [place], [place], [address] Phone: [phone]   Fax:  [phone]  Physical Therapy Evaluation  Patient Details  Name: Shawn Melton MRN: [id number] Date of Birth: [birth year] No data recorded  Encounter Date: 05/26/2019  PT End of Session - 05/26/19 1238    Visit Number  1    Number of Visits  1    Date for PT Re-Evaluation  05/27/19    PT Start Time  5102    PT Stop Time  1238    PT Time Calculation (min)  53 min       Past Medical History:  Diagnosis Date  . Chronic ankle pain   . DJD (degenerative joint disease)   . History of stomach ulcers   . Hypertension   . Obesity   . Reflux   . Skin cancer   . Sleep apnea    using upper/lower mouth piece since 2016 ( not using CPAP)     Past Surgical History:  Procedure Laterality Date  . ABSCESS DRAINAGE      There were no vitals filed for this visit.   Subjective Assessment - 05/26/19 1155    Subjective  Pt reports a little dribble of urine at the end of urination. Denied delay urination, weak flow. Loaded activities: pt sold 2 houses within 1 year with lots of moving and lifting.  Physical routine: walking 3-4 miles. Throwing baseball 100-150 baseballs.  Retired. Hobbies: reading and baseball.         [facility] PT Assessment - 05/26/19 1322      Observation/Other Assessments   Observations  ankles crossed , slouched position      Coordination   Gross Motor Movements are Fluid and Coordinated  --    limited lateral / anterior excursion diaphragm     Sit to Stand   Comments  breathholding       Palpation   Spinal mobility  limited thoracic mobility, increased tightness of paraspinal L > R       Bed Mobility   Bed Mobility  --   head lift, abdominal bulging               Objective measurements completed on examination: See above findings.    Pelvic Floor Special Questions - 05/26/19 1321    Diastasis Recti  2  fingers width below sternum, 3 fingers above umbilicus    External Perineal Exam  through clothing, limited pelvic floor mobility. ( post Tx: increased lengthening and proper contraction without overuse of ab)        [facility] Adult PT Treatment/Exercise - 05/26/19 1322      Therapeutic Activites    Therapeutic Activities  --   education / antamoy/physiology, post op pelvic floor changes     Neuro Re-ed    Neuro Re-ed Details   see pt instructions, technique for deep core and body mechanics to minimzie strain on pelvic/ abdominal mm       Manual Therapy   Manual therapy comments  STM/MWM, PA mob at T/L junction, and paraspinal mm                PT Short Term Goals - 05/26/19 1327      PT SHORT TERM GOAL #1   Title  Pt will demo  proper deep core coordination without chest breathing and optimal excursion of diaphragm/pelvic floor    Time  1    Period  Days  Status  Achieved    Target Date  05/26/19      PT SHORT TERM GOAL #2   Title  Pt will demo IND with pelvic floor exercises in supine  to minimize risk for urinary incontinence    Time  1    Period  Days    Status  Achieved    Target Date  05/26/19      PT SHORT TERM GOAL #3   Title  Pt will demo proper alignment and technique with sitting, logrolling, sit to stand, lifting, body mechanics with ADLs to minimize straining of abdominopelvic floor mm and spine.    Time  1    Period  Days    Status  Achieved    Target Date  05/26/19                Plan - 05/26/19 1325    Clinical Impression Statement  Pt is a 63 yo male who is scheduled for prostate cancer on 05/31/19 . Upon assessment, pt demo'd deficits that increase his risk for urinary incontinence post surgery:  _poor deep core strength _poor body mechanics and co-activation of deep core mm which place downward forces onto pelvic floor  _poor alignment and techniques with lifting, body mechanics _diastasis recti of upper rectus mm    _limited  mobility of diaphragm and pelvic floor                                                                                      These deficits indicate a sign of inefficient intra-abdominal pressure system which is associated with urinary incontinence, increased risk for injuries, and risks for worsening of Sx and impairments. Following Tx today, pt showed proper techniques with functional activities and body mechanics. Pt tolerated manual Tx to increase thoracic mobility, diaphragmatic/ pelvic floor excursion. Pt demo'd quick pelvic floor contraction in coordination with deep core mm. Pt was educated to no perform pelvic floor contractions when catheter is in place. Pt has achieved all his STG and is ready for d/c at this time. Pt will benefit from continued PT with a new order for pelvic PT.     Personal Factors and Comorbidities  Comorbidity 3+    Comorbidities  HBP, DDD, Sleep Apnea, Skin Cancer, Prostate Cancer    Examination-Activity Limitations  Other    Stability/Clinical Decision Making  Evolving/Moderate complexity    Clinical Decision Making  Moderate    Rehab Potential  Good    PT Frequency  One time visit    PT Treatment/Interventions  Patient/family education;Therapeutic activities;Neuromuscular re-education;Moist Heat;Therapeutic exercise;Manual techniques    Consulted and Agree with Plan of Care  Patient       Patient will benefit from skilled therapeutic intervention in order to improve the following deficits and impairments:  Improper body mechanics, Postural dysfunction, Decreased activity tolerance, Increased muscle spasms  Visit Diagnosis: 1. Diastasis of rectus abdominis   2. Abnormal posture   3. Other muscle spasm        Problem List Patient Active Problem List   Diagnosis Date Noted  . Gastroesophageal reflux disease without esophagitis 12/10/2017  . Personal history of other malignant  neoplasm of skin 07/07/2017  . BRBPR (bright red blood per rectum) 01/05/2016  .  History of ischemic colitis 01/05/2016  . Mucus in stool 01/05/2016  . Ankle pain 11/30/2015  . Arthritis, degenerative 11/30/2015  . Family history of colon cancer 11/30/2015  . Family history of malignant neoplasm of prostate 11/03/2015    Jerl Mina ,PT, DPT, E-RYT  05/26/2019, 1:35 PM  [place] MAIN [facility] SERVICES [address] [place], [place], [address] Phone: [phone]   Fax:  [phone]  Name: Shawn Melton MRN: [id number] Date of Birth: 1956/11/30

## 2019-05-27 ENCOUNTER — Other Ambulatory Visit: Payer: Self-pay

## 2019-05-27 ENCOUNTER — Encounter
Admission: RE | Admit: 2019-05-27 | Discharge: 2019-05-27 | Disposition: A | Discharge status: Home / Self Care | Payer: BC Managed Care – PPO | Source: Ambulatory Visit | Attending: Urology | Admitting: Urology

## 2019-05-27 DIAGNOSIS — Z01818 Encounter for other preprocedural examination: Secondary | ICD-10-CM | POA: Insufficient documentation

## 2019-05-27 DIAGNOSIS — Z1159 Encounter for screening for other viral diseases: Secondary | ICD-10-CM | POA: Insufficient documentation

## 2019-05-27 HISTORY — DX: Other specified postprocedural states: Z98.890

## 2019-05-27 HISTORY — DX: Gastro-esophageal reflux disease without esophagitis: K21.9

## 2019-05-27 LAB — CBC
HCT: 43.5 % (ref 39.0–52.0)
Hemoglobin: 14.6 g/dL (ref 13.0–17.0)
MCH: 30.2 pg (ref 26.0–34.0)
MCHC: 33.6 g/dL (ref 30.0–36.0)
MCV: 89.9 fL (ref 80.0–100.0)
Platelets: 259 10*3/uL (ref 150–400)
RBC: 4.84 MIL/uL (ref 4.22–5.81)
RDW: 12.1 % (ref 11.5–15.5)
WBC: 7.4 10*3/uL (ref 4.0–10.5)
nRBC: 0 % (ref 0.0–0.2)

## 2019-05-27 LAB — BASIC METABOLIC PANEL
Anion gap: 10 (ref 5–15)
BUN: 17 mg/dL (ref 8–23)
CO2: 22 mmol/L (ref 22–32)
Calcium: 8.6 mg/dL — ABNORMAL LOW (ref 8.9–10.3)
Chloride: 107 mmol/L (ref 98–111)
Creatinine, Ser: 1.04 mg/dL (ref 0.61–1.24)
GFR calc Af Amer: >60 mL/min (ref >60)
GFR calc non Af Amer: >60 mL/min (ref >60)
Glucose, Bld: 114 mg/dL — ABNORMAL HIGH (ref 70–99)
Potassium: 3.7 mmol/L (ref 3.5–5.1)
Sodium: 139 mmol/L (ref 135–145)

## 2019-05-27 LAB — TYPE AND SCREEN
ABO/RH(D): B POS
Antibody Screen: NEGATIVE

## 2019-05-27 LAB — PROTIME-INR
INR: 1 (ref 0.8–1.2)
Prothrombin Time: 13 seconds (ref 11.4–15.2)

## 2019-05-27 NOTE — Patient Instructions (Signed)
Your procedure is scheduled on: Monday 05/31/19 Report to [place]. To find out your arrival time please call [phone] between 1PM - 3PM on Friday 05/28/19.  Remember: Instructions that are not followed completely may result in serious medical risk, up to and including death, or upon the discretion of your surgeon and anesthesiologist your surgery may need to be rescheduled.     _X__ 1. Do not eat food after midnight the night before your procedure.                 No gum chewing or hard candies. You may drink clear liquids up to 2 hours                 before you are scheduled to arrive for your surgery- DO not drink clear                 liquids within 2 hours of the start of your surgery.                 Clear Liquids include:  water, apple juice without pulp, clear carbohydrate                 drink such as Clearfast or Gatorade, Black Coffee or Tea (Do not add                 anything to coffee or tea).  __X__2.  On the morning of surgery brush your teeth with toothpaste and water, you                 may rinse your mouth with mouthwash if you wish.  Do not swallow any              toothpaste of mouthwash.     _X__ 3.  No Alcohol for 24 hours before or after surgery.   _X__ 4.  Do Not Smoke or use e-cigarettes For 24 Hours Prior to Your Surgery.                 Do not use any chewable tobacco products for at least 6 hours prior to                 surgery.  ____  5.  Bring all medications with you on the day of surgery if instructed.   __X__  6.  Notify your doctor if there is any change in your medical condition      (cold, fever, infections).     Do not wear jewelry, make-up, hairpins, clips or nail polish. Do not wear lotions, powders, or perfumes.  Do not shave 48 hours prior to surgery. Men may shave face and neck. Do not bring valuables to the hospital.    [facility] is not responsible for any belongings or  valuables.  Contacts, dentures/partials or body piercings may not be worn into surgery. Bring a case for your contacts, glasses or hearing aids, a denture cup will be supplied. Leave your suitcase in the car. After surgery it may be brought to your room. For patients admitted to the hospital, discharge time is determined by your treatment team.   Patients discharged the day of surgery will not be allowed to drive home.   Please read over the following fact sheets that you were given:   MRSA Information  __X__ Take these medicines the morning of surgery with A SIP OF WATER:  1. omeprazole (PRILOSEC  2.   3.   4.  5.  6.  ____ Fleet Enema (as directed)   __X__ Use CHG Soap/SAGE wipes as directed  ____ Use inhalers on the day of surgery  ____ Stop metformin/Janumet/Farxiga 2 days prior to surgery    ____ Take 1/2 of usual insulin dose the night before surgery. No insulin the morning          of surgery.   ____ Stop Blood Thinners Coumadin/Plavix/Xarelto/Pleta/Pradaxa/Eliquis/Effient/Aspirin  on   Or contact your Surgeon, Cardiologist or Medical Doctor regarding  ability to stop your blood thinners  __X__ Stop Anti-inflammatories 7 days before surgery such as Advil, Ibuprofen, Motrin,  BC or Goodies Powder, Naprosyn, Naproxen, Aleve, Aspirin    __X__ Stop all herbal supplements, fish oil or vitamin E until after surgery.  MVI OK TO CONTINUE  __X__ Bring C-Pap to the hospital. ORAL DEVICE

## 2019-05-28 LAB — NOVEL CORONAVIRUS, NAA (HOSP ORDER, SEND-OUT TO REF LAB; TAT 18-24 HRS): SARS-CoV-2, NAA: NOT DETECTED

## 2019-05-30 MED ORDER — CEFAZOLIN SODIUM-DEXTROSE 2-4 GM/100ML-% IV SOLN
2.0000 g | INTRAVENOUS | Status: AC
  Administered 2019-05-31: 2 g via INTRAVENOUS

## 2019-05-31 ENCOUNTER — Ambulatory Visit: Payer: BC Managed Care – PPO | Admitting: Certified Registered Nurse Anesthetist

## 2019-05-31 ENCOUNTER — Encounter
Admission: RE | Disposition: A | Discharge status: Home / Self Care | Payer: Self-pay | Source: Home / Self Care | Attending: Urology

## 2019-05-31 ENCOUNTER — Encounter: Payer: Self-pay | Admitting: *Deleted

## 2019-05-31 ENCOUNTER — Observation Stay
Admission: RE | Admit: 2019-05-31 | Discharge: 2019-06-01 | Disposition: A | Discharge status: Home / Self Care | Payer: BC Managed Care – PPO | Attending: Urology | Admitting: Urology

## 2019-05-31 ENCOUNTER — Other Ambulatory Visit: Payer: Self-pay

## 2019-05-31 DIAGNOSIS — Z85828 Personal history of other malignant neoplasm of skin: Secondary | ICD-10-CM | POA: Diagnosis not present

## 2019-05-31 DIAGNOSIS — Z8042 Family history of malignant neoplasm of prostate: Secondary | ICD-10-CM | POA: Insufficient documentation

## 2019-05-31 DIAGNOSIS — Z800 Family history of malignant neoplasm of digestive organs: Secondary | ICD-10-CM | POA: Insufficient documentation

## 2019-05-31 DIAGNOSIS — G4730 Sleep apnea, unspecified: Secondary | ICD-10-CM | POA: Insufficient documentation

## 2019-05-31 DIAGNOSIS — K660 Peritoneal adhesions (postprocedural) (postinfection): Secondary | ICD-10-CM | POA: Insufficient documentation

## 2019-05-31 DIAGNOSIS — E669 Obesity, unspecified: Secondary | ICD-10-CM | POA: Diagnosis not present

## 2019-05-31 DIAGNOSIS — Z79899 Other long term (current) drug therapy: Secondary | ICD-10-CM | POA: Insufficient documentation

## 2019-05-31 DIAGNOSIS — K219 Gastro-esophageal reflux disease without esophagitis: Secondary | ICD-10-CM | POA: Diagnosis not present

## 2019-05-31 DIAGNOSIS — Z881 Allergy status to other antibiotic agents status: Secondary | ICD-10-CM | POA: Diagnosis not present

## 2019-05-31 DIAGNOSIS — I10 Essential (primary) hypertension: Secondary | ICD-10-CM | POA: Diagnosis not present

## 2019-05-31 DIAGNOSIS — Z6831 Body mass index (BMI) 31.0-31.9, adult: Secondary | ICD-10-CM | POA: Insufficient documentation

## 2019-05-31 DIAGNOSIS — C61 Malignant neoplasm of prostate: Secondary | ICD-10-CM

## 2019-05-31 HISTORY — PX: ROBOTIC PELVIC AND PARA-AORTIC LYMPH NODE DISSECTION: SHX6210

## 2019-05-31 LAB — ABO/RH: ABO/RH(D): B POS

## 2019-05-31 SURGERY — LYMPHADENECTOMY, PARA-AORTIC AND PELVIC, ROBOT-ASSISTED, LAPAROSCOPIC
Anesthesia: General | Site: Abdomen

## 2019-05-31 MED ORDER — GLYCOPYRROLATE 0.2 MG/ML IJ SOLN
INTRAMUSCULAR | Status: AC
  Filled 2019-05-31: qty 1

## 2019-05-31 MED ORDER — FENTANYL CITRATE (PF) 100 MCG/2ML IJ SOLN
25.0000 ug | INTRAMUSCULAR | Status: DC | PRN
  Administered 2019-05-31: 25 ug via INTRAVENOUS

## 2019-05-31 MED ORDER — KETAMINE HCL 50 MG/ML IJ SOLN
INTRAMUSCULAR | Status: AC
  Filled 2019-05-31: qty 10

## 2019-05-31 MED ORDER — LOSARTAN POTASSIUM 50 MG PO TABS
50.0000 mg | ORAL_TABLET | Freq: Every day | ORAL | Status: DC
  Administered 2019-05-31: 21:00:00 50 mg via ORAL
  Filled 2019-05-31: qty 1

## 2019-05-31 MED ORDER — ROCURONIUM BROMIDE 50 MG/5ML IV SOLN
INTRAVENOUS | Status: AC
  Filled 2019-05-31: qty 1

## 2019-05-31 MED ORDER — LACTATED RINGERS IV SOLN
INTRAVENOUS | Status: DC | PRN
  Administered 2019-05-31: 12:00:00 via INTRAVENOUS

## 2019-05-31 MED ORDER — DIPHENHYDRAMINE HCL 50 MG/ML IJ SOLN: 12.5000 mg | Freq: Four times a day (QID) | INTRAMUSCULAR | Status: DC | PRN

## 2019-05-31 MED ORDER — FENTANYL CITRATE (PF) 100 MCG/2ML IJ SOLN
INTRAMUSCULAR | Status: DC | PRN
  Administered 2019-05-31 (×2): 50 ug via INTRAVENOUS

## 2019-05-31 MED ORDER — EPHEDRINE SULFATE 50 MG/ML IJ SOLN
INTRAMUSCULAR | Status: DC | PRN
  Administered 2019-05-31 (×2): 5 mg via INTRAVENOUS

## 2019-05-31 MED ORDER — LIDOCAINE HCL (CARDIAC) PF 100 MG/5ML IV SOSY
PREFILLED_SYRINGE | INTRAVENOUS | Status: DC | PRN
  Administered 2019-05-31: 100 mg via INTRAVENOUS

## 2019-05-31 MED ORDER — DOCUSATE SODIUM 100 MG PO CAPS
100.0000 mg | ORAL_CAPSULE | Freq: Two times a day (BID) | ORAL | Status: DC
  Administered 2019-05-31 – 2019-06-01 (×2): 100 mg via ORAL
  Filled 2019-05-31 (×2): qty 1

## 2019-05-31 MED ORDER — HEPARIN SODIUM (PORCINE) 5000 UNIT/ML IJ SOLN
5000.0000 [IU] | Freq: Three times a day (TID) | INTRAMUSCULAR | Status: DC
  Administered 2019-05-31 – 2019-06-01 (×2): 5000 [IU] via SUBCUTANEOUS
  Filled 2019-05-31 (×2): qty 1

## 2019-05-31 MED ORDER — OXYCODONE HCL 5 MG/5ML PO SOLN: 5.0000 mg | Freq: Once | ORAL | Status: DC | PRN

## 2019-05-31 MED ORDER — DIPHENHYDRAMINE HCL 12.5 MG/5ML PO ELIX
12.5000 mg | ORAL_SOLUTION | Freq: Four times a day (QID) | ORAL | Status: DC | PRN
  Filled 2019-05-31: qty 5

## 2019-05-31 MED ORDER — PHENYLEPHRINE HCL (PRESSORS) 10 MG/ML IV SOLN
INTRAVENOUS | Status: AC
  Filled 2019-05-31: qty 1

## 2019-05-31 MED ORDER — HEMOSTATIC AGENTS (NO CHARGE) OPTIME
TOPICAL | Status: DC | PRN
  Administered 2019-05-31: 1

## 2019-05-31 MED ORDER — ROCURONIUM BROMIDE 100 MG/10ML IV SOLN
INTRAVENOUS | Status: DC | PRN
  Administered 2019-05-31: 30 mg via INTRAVENOUS
  Administered 2019-05-31: 50 mg via INTRAVENOUS

## 2019-05-31 MED ORDER — ONDANSETRON HCL 4 MG/2ML IJ SOLN: 4.0000 mg | INTRAMUSCULAR | Status: DC | PRN

## 2019-05-31 MED ORDER — PANTOPRAZOLE SODIUM 40 MG PO TBEC
80.0000 mg | DELAYED_RELEASE_TABLET | Freq: Every day | ORAL | Status: DC
  Administered 2019-06-01: 10:00:00 40 mg via ORAL
  Filled 2019-05-31: qty 2

## 2019-05-31 MED ORDER — ACETAMINOPHEN 325 MG PO TABS: 650.0000 mg | ORAL_TABLET | ORAL | Status: DC | PRN

## 2019-05-31 MED ORDER — BUPIVACAINE HCL 0.5 % IJ SOLN
INTRAMUSCULAR | Status: DC | PRN
  Administered 2019-05-31: 20 mL

## 2019-05-31 MED ORDER — ONDANSETRON HCL 4 MG/2ML IJ SOLN
INTRAMUSCULAR | Status: AC
  Filled 2019-05-31: qty 2

## 2019-05-31 MED ORDER — ALPRAZOLAM 0.25 MG PO TABS: 0.2500 mg | ORAL_TABLET | Freq: Every evening | ORAL | Status: DC | PRN

## 2019-05-31 MED ORDER — SUGAMMADEX SODIUM 200 MG/2ML IV SOLN
INTRAVENOUS | Status: DC | PRN
  Administered 2019-05-31: 200 mg via INTRAVENOUS

## 2019-05-31 MED ORDER — CEFAZOLIN SODIUM-DEXTROSE 1-4 GM/50ML-% IV SOLN
1.0000 g | Freq: Three times a day (TID) | INTRAVENOUS | Status: AC
  Administered 2019-05-31 – 2019-06-01 (×2): 1 g via INTRAVENOUS
  Filled 2019-05-31 (×2): qty 50

## 2019-05-31 MED ORDER — ACETAMINOPHEN 10 MG/ML IV SOLN
INTRAVENOUS | Status: DC | PRN
  Administered 2019-05-31: 1000 mg via INTRAVENOUS

## 2019-05-31 MED ORDER — OXYCODONE HCL 5 MG PO TABS: 5.0000 mg | ORAL_TABLET | Freq: Once | ORAL | Status: DC | PRN

## 2019-05-31 MED ORDER — BUPIVACAINE HCL (PF) 0.5 % IJ SOLN
INTRAMUSCULAR | Status: AC
  Filled 2019-05-31: qty 30

## 2019-05-31 MED ORDER — KETAMINE HCL 50 MG/ML IJ SOLN
INTRAMUSCULAR | Status: DC | PRN
  Administered 2019-05-31 (×2): 25 mg via INTRAMUSCULAR

## 2019-05-31 MED ORDER — FENTANYL CITRATE (PF) 100 MCG/2ML IJ SOLN
INTRAMUSCULAR | Status: AC
  Filled 2019-05-31: qty 2

## 2019-05-31 MED ORDER — MIDAZOLAM HCL 2 MG/2ML IJ SOLN
INTRAMUSCULAR | Status: DC | PRN
  Administered 2019-05-31: 2 mg via INTRAVENOUS

## 2019-05-31 MED ORDER — CEFAZOLIN SODIUM-DEXTROSE 2-4 GM/100ML-% IV SOLN
INTRAVENOUS | Status: AC
  Filled 2019-05-31: qty 100

## 2019-05-31 MED ORDER — SODIUM CHLORIDE 0.9 % IV SOLN
INTRAVENOUS | Status: DC | PRN
  Administered 2019-05-31: 50 ug/min via INTRAVENOUS

## 2019-05-31 MED ORDER — OXYBUTYNIN CHLORIDE 5 MG PO TABS: 5.0000 mg | ORAL_TABLET | Freq: Three times a day (TID) | ORAL | Status: DC | PRN

## 2019-05-31 MED ORDER — ONDANSETRON HCL 4 MG/2ML IJ SOLN
INTRAMUSCULAR | Status: DC | PRN
  Administered 2019-05-31: 4 mg via INTRAVENOUS

## 2019-05-31 MED ORDER — MORPHINE SULFATE (PF) 2 MG/ML IV SOLN
2.0000 mg | INTRAVENOUS | Status: DC | PRN
  Administered 2019-06-01: 10:00:00 2 mg via INTRAVENOUS
  Filled 2019-05-31: qty 1

## 2019-05-31 MED ORDER — GLYCOPYRROLATE 0.2 MG/ML IJ SOLN
INTRAMUSCULAR | Status: DC | PRN
  Administered 2019-05-31: 0.2 mg via INTRAVENOUS

## 2019-05-31 MED ORDER — OXYCODONE-ACETAMINOPHEN 5-325 MG PO TABS
1.0000 | ORAL_TABLET | ORAL | Status: DC | PRN
  Administered 2019-05-31: 1 via ORAL
  Filled 2019-05-31: qty 1

## 2019-05-31 MED ORDER — ACETAMINOPHEN 10 MG/ML IV SOLN
INTRAVENOUS | Status: AC
  Filled 2019-05-31: qty 100

## 2019-05-31 MED ORDER — ZOLPIDEM TARTRATE 5 MG PO TABS: 5.0000 mg | ORAL_TABLET | Freq: Every evening | ORAL | Status: DC | PRN

## 2019-05-31 MED ORDER — SUCCINYLCHOLINE CHLORIDE 20 MG/ML IJ SOLN
INTRAMUSCULAR | Status: DC | PRN
  Administered 2019-05-31: 120 mg via INTRAVENOUS

## 2019-05-31 MED ORDER — THROMBIN 5000 UNITS EX SOLR
CUTANEOUS | Status: AC
  Filled 2019-05-31: qty 5000

## 2019-05-31 MED ORDER — PROPOFOL 10 MG/ML IV BOLUS
INTRAVENOUS | Status: DC | PRN
  Administered 2019-05-31: 200 mg via INTRAVENOUS

## 2019-05-31 MED ORDER — DEXAMETHASONE SODIUM PHOSPHATE 10 MG/ML IJ SOLN
INTRAMUSCULAR | Status: AC
  Filled 2019-05-31: qty 1

## 2019-05-31 MED ORDER — EPHEDRINE SULFATE 50 MG/ML IJ SOLN
INTRAMUSCULAR | Status: AC
  Filled 2019-05-31: qty 1

## 2019-05-31 MED ORDER — DEXAMETHASONE SODIUM PHOSPHATE 10 MG/ML IJ SOLN
INTRAMUSCULAR | Status: DC | PRN
  Administered 2019-05-31: 5 mg via INTRAVENOUS

## 2019-05-31 MED ORDER — MIDAZOLAM HCL 2 MG/2ML IJ SOLN
INTRAMUSCULAR | Status: AC
  Filled 2019-05-31: qty 2

## 2019-05-31 MED ORDER — LIDOCAINE HCL (PF) 2 % IJ SOLN
INTRAMUSCULAR | Status: AC
  Filled 2019-05-31: qty 10

## 2019-05-31 MED ORDER — THROMBIN 5000 UNITS EX SOLR
CUTANEOUS | Status: DC | PRN
  Administered 2019-05-31: 5000 [IU] via TOPICAL

## 2019-05-31 MED ORDER — PROPOFOL 10 MG/ML IV BOLUS
INTRAVENOUS | Status: AC
  Filled 2019-05-31: qty 20

## 2019-05-31 MED ORDER — SODIUM CHLORIDE 0.9 % IV SOLN: INTRAVENOUS | Status: DC

## 2019-05-31 SURGICAL SUPPLY — 95 items
ANCHOR TIS RET SYS 235ML (MISCELLANEOUS) IMPLANT
APPLICATOR SURGIFLO ENDO (HEMOSTASIS) ×4 IMPLANT
APPLIER CLIP LOGIC TI 5 (MISCELLANEOUS) IMPLANT
BAG URINE DRAINAGE (UROLOGICAL SUPPLIES) ×4 IMPLANT
BLADE CLIPPER SURG (BLADE) ×4 IMPLANT
BULB RESERV EVAC DRAIN JP 100C (MISCELLANEOUS) IMPLANT
CANISTER SUCT 1200ML W/VALVE (MISCELLANEOUS) ×4 IMPLANT
CATH DRAINAGE MALECOT 26FR (CATHETERS) ×3 IMPLANT
CATH FOL 2WAY LX 18X5 (CATHETERS) ×8 IMPLANT
CATH MALECOT (CATHETERS) ×4
CHLORAPREP W/TINT 26 (MISCELLANEOUS) ×8 IMPLANT
CLIP SUT LAPRA TY ABSORB (SUTURE) IMPLANT
CLIP VESOLOCK LG 6/CT PURPLE (CLIP) ×12 IMPLANT
CORD BIP STRL DISP 12FT (MISCELLANEOUS) IMPLANT
CORD MONOPOLAR M/FML 12FT (MISCELLANEOUS) IMPLANT
COVER TIP SHEARS 8 DVNC (MISCELLANEOUS) ×6 IMPLANT
COVER TIP SHEARS 8MM DA VINCI (MISCELLANEOUS) ×2
COVER WAND RF STERILE (DRAPES) ×4 IMPLANT
DEFOGGER SCOPE WARMER CLEARIFY (MISCELLANEOUS) ×4 IMPLANT
DERMABOND ADVANCED (GAUZE/BANDAGES/DRESSINGS) ×1
DERMABOND ADVANCED .7 DNX12 (GAUZE/BANDAGES/DRESSINGS) ×3 IMPLANT
DRAIN CHANNEL JP 15F RND 16 (MISCELLANEOUS) IMPLANT
DRAIN CHANNEL JP 19F (MISCELLANEOUS) IMPLANT
DRAPE COLUMN DVNC XI (DISPOSABLE) ×3 IMPLANT
DRAPE DA VINCI XI COLUMN (DISPOSABLE) ×1
DRAPE LEGGINS SURG 28X43 STRL (DRAPES) ×4 IMPLANT
DRAPE SHEET LG 3/4 BI-LAMINATE (DRAPES) ×4 IMPLANT
DRAPE SURG 17X11 SM STRL (DRAPES) ×16 IMPLANT
DRAPE UNDER BUTTOCK W/FLU (DRAPES) ×4 IMPLANT
DRIVER LRG NEEDLE DA VINCI (INSTRUMENTS) ×2
DRIVER NDLE LRG DVNC (INSTRUMENTS) ×6 IMPLANT
DRSG TELFA 3X8 NADH (GAUZE/BANDAGES/DRESSINGS) ×4 IMPLANT
ELECT REM PT RETURN 9FT ADLT (ELECTROSURGICAL) ×4
ELECTRODE REM PT RTRN 9FT ADLT (ELECTROSURGICAL) ×3 IMPLANT
FORCEPS MARYLAND BIPOLAR 8X55 (INSTRUMENTS) ×1
FORCEPS MRYLND BPLR 8X55 DVNC (INSTRUMENTS) ×3 IMPLANT
GLOVE BIO SURGEON STRL SZ 6.5 (GLOVE) ×12 IMPLANT
GOWN STRL REUS W/ TWL LRG LVL3 (GOWN DISPOSABLE) ×9 IMPLANT
GOWN STRL REUS W/TWL LRG LVL3 (GOWN DISPOSABLE) ×3
GRASPER SUT TROCAR 14GX15 (MISCELLANEOUS) ×4 IMPLANT
HEMOSTAT SURGICEL 2X14 (HEMOSTASIS) ×4 IMPLANT
HOLDER FOLEY CATH W/STRAP (MISCELLANEOUS) ×4 IMPLANT
IRRIGATION STRYKERFLOW (MISCELLANEOUS) ×3 IMPLANT
IRRIGATOR STRYKERFLOW (MISCELLANEOUS) ×4
IV LACTATED RINGERS 1000ML (IV SOLUTION) ×4 IMPLANT
KIT PINK PAD W/HEAD ARE REST (MISCELLANEOUS) ×4
KIT PINK PAD W/HEAD ARM REST (MISCELLANEOUS) ×3 IMPLANT
LABEL OR SOLS (LABEL) IMPLANT
MARKER SKIN DUAL TIP RULER LAB (MISCELLANEOUS) ×4 IMPLANT
NEEDLE HYPO 22GX1.5 SAFETY (NEEDLE) ×4 IMPLANT
NEEDLE INSUFFLATION 14GA 120MM (NEEDLE) ×4 IMPLANT
NS IRRIG 500ML POUR BTL (IV SOLUTION) ×4 IMPLANT
OBTURATOR OPTICAL STANDARD 8MM (TROCAR) ×1
OBTURATOR OPTICAL STND 8 DVNC (TROCAR) ×3
OBTURATOR OPTICALSTD 8 DVNC (TROCAR) ×3 IMPLANT
PACK LAP CHOLECYSTECTOMY (MISCELLANEOUS) ×4 IMPLANT
PENCIL ELECTRO HAND CTR (MISCELLANEOUS) ×4 IMPLANT
PROGRASP ENDOWRIST DA VINCI (INSTRUMENTS)
PROGRASP ENDOWRIST DVNC (INSTRUMENTS) IMPLANT
RELOAD STAPLER WHITE 60MM (STAPLE) IMPLANT
SET TUBE SMOKE EVAC HIGH FLOW (TUBING) ×4 IMPLANT
SLEEVE ENDOPATH XCEL 5M (ENDOMECHANICALS) ×8 IMPLANT
SOLUTION ELECTROLUBE (MISCELLANEOUS) ×4 IMPLANT
SPOGE SURGIFLO 8M (HEMOSTASIS) ×1
SPONGE LAP 4X18 RFD (DISPOSABLE) IMPLANT
SPONGE SURGIFLO 8M (HEMOSTASIS) ×3 IMPLANT
SPONGE VERSALON 4X4 4PLY (MISCELLANEOUS) IMPLANT
STAPLE ECHEON FLEX 60 POW ENDO (STAPLE) IMPLANT
STAPLER RELOAD WHITE 60MM (STAPLE)
STAPLER SKIN PROX 35W (STAPLE) ×4 IMPLANT
STRAP SAFETY 5IN WIDE (MISCELLANEOUS) ×4 IMPLANT
SURGILUBE 2OZ TUBE FLIPTOP (MISCELLANEOUS) ×4 IMPLANT
SUT DVC VLOC 90 3-0 CV23 UNDY (SUTURE) ×8 IMPLANT
SUT DVC VLOC 90 3-0 CV23 VLT (SUTURE) ×4
SUT ETHILON 3-0 FS-10 30 BLK (SUTURE)
SUT MNCRL 4-0 (SUTURE) ×2
SUT MNCRL 4-0 27XMFL (SUTURE) ×6
SUT PROLENE 5 0 RB 1 DA (SUTURE) IMPLANT
SUT SILK 2 0 SH (SUTURE) ×4 IMPLANT
SUT VIC AB 0 CT1 36 (SUTURE) ×8 IMPLANT
SUT VIC AB 2-0 CT1 (SUTURE) ×8 IMPLANT
SUT VIC AB 2-0 SH 27 (SUTURE) ×2
SUT VIC AB 2-0 SH 27XBRD (SUTURE) ×6 IMPLANT
SUT VICRYL 0 AB UR-6 (SUTURE) ×4 IMPLANT
SUTURE DVC VLC 90 3-0 CV23 VLT (SUTURE) ×3 IMPLANT
SUTURE EHLN 3-0 FS-10 30 BLK (SUTURE) IMPLANT
SUTURE MNCRL 4-0 27XMF (SUTURE) ×6 IMPLANT
SYR 10ML LL (SYRINGE) ×4 IMPLANT
SYR BULB IRRIG 60ML STRL (SYRINGE) ×4 IMPLANT
SYRINGE IRR TOOMEY STRL 70CC (SYRINGE) ×4 IMPLANT
TAPE CLOTH 10X20 WHT NS LF (TAPE) ×6 IMPLANT
TAPE CLOTH 2X10 WHT NS LF (TAPE) ×2
TROCAR ENDOPATH XCEL 12X100 BL (ENDOMECHANICALS) ×8 IMPLANT
TROCAR XCEL 12X100 BLDLESS (ENDOMECHANICALS) ×4 IMPLANT
TROCAR XCEL NON-BLD 5MMX100MML (ENDOMECHANICALS) ×4 IMPLANT

## 2019-05-31 NOTE — H&P (Signed)
05/31/19 11:15 AM   Shawn Melton Sep 08, 1956 [id number]   HPI: 63 year old male with high risk prostate cancer who presents today for prostatectomy.  Please see previous notes for details.  Metastatic work-up negative.  No previous abdominal surgeries.   PMH: Past Medical History:  Diagnosis Date  . Chronic ankle pain   . DJD (degenerative joint disease)   . GERD (gastroesophageal reflux disease)   . H/O endoscopy   . History of stomach ulcers   . Hx of colonoscopy   . Hypertension   . Obesity   . Reflux   . Skin cancer   . Sleep apnea    using upper/lower mouth piece since 2016 ( not using CPAP)     Surgical History: Past Surgical History:  Procedure Laterality Date  . ABSCESS DRAINAGE      Home Medications:  Current Meds  Medication Sig  . ALPRAZolam (XANAX) 0.5 MG tablet Take 0.25 mg by mouth at bedtime as needed for sleep.   Marland Kitchen losartan (COZAAR) 50 MG tablet Take 50 mg by mouth at bedtime.   . Multiple Vitamins-Minerals (ONE-A-DAY MENS 50+ ADVANTAGE) TABS Take 1 tablet by mouth daily.   Marland Kitchen omeprazole (PRILOSEC) 40 MG capsule TAKE ONE CAPSULE BY MOUTH EVERY DAY     Allergies:  Allergies  Allergen Reactions  . Neosporin [Bacitracin-Polymyxin B] Itching and Other (See Comments)    redness    Family History: Family History  Problem Relation Age of Onset  . Diabetes Father   . Stroke Father   . Hypertension Mother   . Colon cancer Paternal Uncle   . Prostate cancer Cousin     Social History:  reports that he has never smoked. He has never used smokeless tobacco. He reports that he does not drink alcohol or use drugs.  ROS: Negative x 12  Physical Exam: BP (!) 168/94   Pulse 82   Temp (!) 97.5 F (36.4 C) (Tympanic)   Resp 20   SpO2 98%   Constitutional:  Alert and oriented, No acute distress. HEENT: [place] AT, moist mucus membranes.  Trachea midline, no masses. Cardiovascular: No clubbing, cyanosis, or edema. RRR. Respiratory: Normal respiratory  effort, no increased work of breathing. CTAB. GI: Abdomen is soft, nontender, nondistended, no abdominal masses GU: No CVA tenderness. Skin: No rashes, bruises or suspicious lesions. Neurologic: Grossly intact, no focal deficits, moving all 4 extremities. Psychiatric: Normal mood and affect.  Laboratory Data: Lab Results  Component Value Date   WBC 7.4 05/27/2019   HGB 14.6 05/27/2019   HCT 43.5 05/27/2019   MCV 89.9 05/27/2019   PLT 259 05/27/2019    Lab Results  Component Value Date   CREATININE 1.04 05/27/2019    Lab Results  Component Value Date   TESTOSTERONE 236 (L) 12/07/2015   Urinalysis    Component Value Date/Time   APPEARANCEUR Clear 05/24/2019 1003   GLUCOSEU Negative 05/24/2019 1003   BILIRUBINUR Negative 05/24/2019 1003   PROTEINUR Negative 05/24/2019 1003   NITRITE Negative 05/24/2019 1003   LEUKOCYTESUR Negative 05/24/2019 1003    Lab Results  Component Value Date   LABMICR See below: 05/24/2019   WBCUA 0-5 05/24/2019   LABEPIT None seen 05/24/2019   BACTERIA None seen 05/24/2019    Pertinent Imaging: CT abdomen pelvis with contrast and bone scan negative for metastatic disease 04/2019  Assessment & Plan:    1.  Prostate cancer The patient was counseled about the natural history of prostate cancer and the standard  treatment options that are available for prostate cancer. It was explained to him how his age and life expectancy, clinical stage, Gleason score, and PSA affect his prognosis, the decision to proceed with additional staging studies, as well as how that information influences recommended treatment strategies. We discussed the roles for active surveillance, radiation therapy, surgical therapy, androgen deprivation, as well as ablative therapy options for the treatment of prostate cancer as appropriate to his individual cancer situation. We discussed the risks and benefits of these options with regard to their impact on cancer control and also  in terms of potential adverse events, complications, and impact on quality of life particularly related to urinary, bowel, and sexual function. The patient was encouraged to ask questions throughout the discussion today and all questions were answered to his stated satisfaction. In addition, the patient was provided with and/or directed to appropriate resources and literature for further education about prostate cancer treatment options.  We discussed surgical therapy for prostate cancer including the different available surgical approaches. We discussed, in detail, the risks and expectations of surgery with regard to cancer control, urinary control, and erectile dysfunction as well as expected post operative cover he processed. Additional risks of surgery including but not limited to bleeding, infection, hernia formation, nerve damage, fistual formation, bowel/rectal injury, potentially necessitating colostomy, damage to the urinary tract resulting in urinary leakage, urethral stricture, and cardiopulmonary risk such as myocardial infarction, stroke, death, thromboembolism etc. were explained. The risk of open surgical conversion for robotics/laparoscopic prostatectomy is also discussed.  Specifically today in addition to this, we discussed the findings of an enlarged right seminal vesicle.  It appears that most of his disease on the left side.  We discussed the differential diagnosis for asymmetry of the seminal vesicles including ejaculatory duct obstruction for cancer within the prostatic fossa, inflammation/artifact from recent biopsy, anatomic variations and or disease within the seminal vesicle itself.  He understands given the presence of his high risk disease, if he were to have disease outside of the prostate including adverse pathologic features including bladder neck invasion, seminal vesicle involvement, metastatic disease to the lymph nodes, etc. adjuvant therapies which strongly be considered  including adjuvant radiation and/or ADT.  He is a higher risk for this due to the extent of his disease and CT scan findings.  Alternatively, radiation was discussed in detail with 2 to 3 years of ADT.    He was seen in consultation with Dr. Donella Stade but declined radiation and is elected to pursue surgery.  We plan for a wide margin, non-nerve sparing disease with bilateral pelvic lymph node dissection.  He understands all of this.  Hollice Espy, MD  [facility] [address], [place], [place] [address] [phone]

## 2019-05-31 NOTE — Transfer of Care (Signed)
Immediate Anesthesia Transfer of Care Note  Patient: Shawn Melton  Procedure(s) Performed: ROBOTIC ASSISTED LAPAROSCOPIC RADICAL PROSTATECTOMY-Aborted (N/A Abdomen) PELVIC LYMPH NODE DISSECTION (Left Abdomen)  Patient Location: PACU  Anesthesia Type:General  Level of Consciousness: awake, alert  and oriented  Airway & Oxygen Therapy: Patient Spontanous Breathing and Patient connected to face mask oxygen  Post-op Assessment: Report given to RN, Post -op Vital signs reviewed and stable and Patient moving all extremities X 4  Post vital signs: Reviewed and stable  Last Vitals:  Vitals Value Taken Time  BP 159/89 05/31/19 1402  Temp    Pulse 85 05/31/19 1406  Resp 18 05/31/19 1406  SpO2 100 % 05/31/19 1406  Vitals shown include unvalidated device data.  Last Pain:  Vitals:   05/31/19 1011  TempSrc: Tympanic  PainSc: 0-No pain         Complications: No apparent anesthesia complications

## 2019-05-31 NOTE — Anesthesia Preprocedure Evaluation (Signed)
Anesthesia Evaluation  Patient identified by MRN, date of birth, ID band Patient awake    Reviewed: Allergy & Precautions, H&P , NPO status , Patient's Chart, lab work & pertinent test results  History of Anesthesia Complications Negative for: history of anesthetic complications  Airway Mallampati: III  TM Distance: <3 FB Neck ROM: limited    Dental  (+) Chipped   Pulmonary neg shortness of breath, sleep apnea ,           Cardiovascular Exercise Tolerance: Good hypertension, (-) angina(-) Past MI and (-) DOE      Neuro/Psych negative neurological ROS  negative psych ROS   GI/Hepatic Neg liver ROS, GERD  Medicated and Controlled,  Endo/Other  negative endocrine ROS  Renal/GU      Musculoskeletal   Abdominal   Peds  Hematology negative hematology ROS (+)   Anesthesia Other Findings Past Medical History: No date: Chronic ankle pain No date: DJD (degenerative joint disease) No date: GERD (gastroesophageal reflux disease) No date: H/O endoscopy No date: History of stomach ulcers No date: Hx of colonoscopy No date: Hypertension No date: Obesity No date: Reflux No date: Skin cancer No date: Sleep apnea     Comment:  using upper/lower mouth piece since 2016 ( not using               CPAP)   Past Surgical History: No date: ABSCESS DRAINAGE     Reproductive/Obstetrics negative OB ROS                             Anesthesia Physical Anesthesia Plan  ASA: III  Anesthesia Plan: General ETT   Post-op Pain Management:    Induction: Intravenous  PONV Risk Score and Plan: Ondansetron, Dexamethasone, Midazolam and Treatment may vary due to age or medical condition  Airway Management Planned: Oral ETT and Video Laryngoscope Planned  Additional Equipment:   Intra-op Plan:   Post-operative Plan: Extubation in OR  Informed Consent: I have reviewed the patients History and Physical,  chart, labs and discussed the procedure including the risks, benefits and alternatives for the proposed anesthesia with the patient or authorized representative who has indicated his/her understanding and acceptance.     Dental Advisory Given  Plan Discussed with: Anesthesiologist, CRNA and Surgeon  Anesthesia Plan Comments: (Patient consented for risks of anesthesia including but not limited to:  - adverse reactions to medications - damage to teeth, lips or other oral mucosa - sore throat or hoarseness - Damage to heart, brain, lungs or loss of life  Patient voiced understanding.)        Anesthesia Quick Evaluation

## 2019-05-31 NOTE — Anesthesia Procedure Notes (Signed)
Procedure Name: Intubation Date/Time: 05/31/2019 11:56 AM Performed by: Caryl Asp, CRNA Pre-anesthesia Checklist: Patient identified, Patient being monitored, Timeout performed, Emergency Drugs available and Suction available Patient Re-evaluated:Patient Re-evaluated prior to induction Oxygen Delivery Method: Circle system utilized Preoxygenation: Pre-oxygenation with 100% oxygen Induction Type: IV induction Ventilation: Mask ventilation with difficulty, Two handed mask ventilation required and Oral airway inserted - appropriate to patient size Laryngoscope Size: McGraph and 4 Grade View: Grade I Tube type: Oral Tube size: 8.0 mm Number of attempts: 1 Airway Equipment and Method: Stylet and Oral airway Placement Confirmation: ETT inserted through vocal cords under direct vision,  positive ETCO2 and breath sounds checked- equal and bilateral Secured at: 24 cm Tube secured with: Tape Dental Injury: Teeth and Oropharynx as per pre-operative assessment

## 2019-05-31 NOTE — Op Note (Signed)
Date of procedure: 05/31/19  Preoperative diagnosis:  1. Prostate cancer  Postoperative diagnosis:  1. Prostate cancer 2. Pelvic adhesions  Procedure: 1. Attempted/aborted robotic prostatectomy 2. Left pelvic lymph node dissection  Surgeon: Hollice Espy, MD  Assistant: Nickolas Madrid, MD  Anesthesia: General  Complications: None  Intraoperative findings: Grossly enlarged bilateral pelvic lymph nodes.  Abnormal/dense pelvic adhesions with no discernible planes between the pubic symphysis, endopelvic fascia and prostate.  Case aborted due to concern for advanced metastatic disease and concern for injury.  EBL: Minimal  Specimens: Left pelvic lymph node for frozen section  Drains: 38 French Foley catheter  Indication: Shawn Melton is a 63 y.o. patient with high risk prostate cancer who elected to proceed with bilateral pelvic lymph node dissection, radical prostatectomy.  After reviewing the management options for treatment, he elected to proceed with the above surgical procedure(s). We have discussed the potential benefits and risks of the procedure, side effects of the proposed treatment, the likelihood of the patient achieving the goals of the procedure, and any potential problems that might occur during the procedure or recuperation. Informed consent has been obtained.  Description of procedure:  The patient was taken to the operating room and general anesthesia was induced.  The patient was placed in the dorsal lithotomy position, prepped and draped in the usual sterile fashion, and preoperative antibiotics were administered he was secured to the operating table with care taken to pad all pressure points.. A preoperative time-out was performed.   A 16 French Foley catheter is placed sterilely on the field.  At this point time, the Veress needle was placed to superior to the umbilicus using a penetrating towel clip to tent the fascia.  Opening pressures were low, approximately 6  cm of water.  The abdomen was then insufflated without difficulty which is well-tolerated.  A 12 French supraumbilical port was then placed without difficulty and the robotic camera was brought in.  The remainder of the trochars were placed under guidance in a linear fashion just below the level of the umbilicus including 3 8 mm robotic ports and a 12 mm assistant port in the right lower quadrant.  A 5 mm assistant port was also placed in the right upper quadrant for suction.  The patient was then placed in slight Trendelenburg, approximately 15 degrees and the robot was brought in and docked.  At this point time, the bladder was taken down by incising the median umbilical ligaments and creating a plane between the anterior abdominal wall and the bladder.  This plane was relatively normal however within the iliac fossa on either side, there is obvious noted bilateral pelvic lymphadenopathy both overlying the external iliac artery and vein.  Continue the dissection down to the pelvis.  Upon reaching the pubic bone, the planes became exceptionally adherent and grossly abnormal.  This was circumferential all the way across the entire pubic ramus and symphysis.  There was no discernible plane between the pelvic sidewall and the endopelvic fascia.  Using hot scissor I attempted to both bluntly and sharply dissect with relatively little success.  This is highly concerning for advanced metastatic disease.  At this point time, I turned my attention to the left iliac fossa where a grossly abnormal lymph node was identified overlying the external iliac artery and vein.  Notably, this was hypervascular with multiple large lymphatic channels.  Several of these channels were ligated using Weck clips.  Once the lymph node was able to be mobilized it was  passed off the field for frozen section.  I then turned my attention back to the pelvis.  Quite some effort, continue to aggressively identify any normal tissue planes but  again was relatively unsuccessful.  At this point time, I contemplated aborting the procedure for multiple reasons primarily due to the concern for advanced local disease as well as the lack of tissue planes which would likely lead to worse outcomes in terms of regaining urinary continence and also concern for the posterior plane and possible rectal injury.  The staging CT scan was reviewed again and in retrospect, there was some abnormal tissue anterior to the prostate and possibly invasion of the prostate into the base of the bladder on sagittal imaging.  Additionally, the left seminal vesicle was grossly enlarged which was known however the spleen appeared to be hazy on CT scan.  All of these findings solidify the decision to abort the case.  Careful hemostasis was achieved by placing Surgicel into the deep pelvis along with the left iliac fossa along with Surgi-Flo.  The bladder was then replaced back into its normal anatomic position and the 212 mm port sites were closed using a Carter-Thomason needle and a 0 Vicryl suture.  The robot was then undocked and the abdomen was desufflated taking the ports out under direct visualization.  Ultimately, the Vicryls were tied down.  Each of the wounds were then irrigated using copious amounts of saline.  Marcaine was instilled to each of the port sites.  The skin was then closed using a running subcuticular 5-0 Monocryl suture.  Dermabond was applied.  The patient was then repositioned in supine position, resume seizure, taken to the PACU in stable condition.  Plan: I called the patient sister, Shawn Melton to discuss intraoperative decision making and findings.  We will plan to keep the patient overnight for observation.  I will call him later this evening to discuss the rationale for boarding the case.  Hollice Espy, M.D.

## 2019-05-31 NOTE — Anesthesia Postprocedure Evaluation (Signed)
Anesthesia Post Note  Patient: Shawn Melton  Procedure(s) Performed: XI ROBOTIC PELVIC lymph NODE DISSECTION (Left Abdomen)  Patient location during evaluation: PACU Anesthesia Type: General Level of consciousness: awake and alert Pain management: pain level controlled Vital Signs Assessment: post-procedure vital signs reviewed and stable Respiratory status: spontaneous breathing, nonlabored ventilation, respiratory function stable and patient connected to nasal cannula oxygen Cardiovascular status: blood pressure returned to baseline and stable Postop Assessment: no apparent nausea or vomiting Anesthetic complications: no     Last Vitals:  Vitals:   05/31/19 1728 05/31/19 1812  BP: (!) 152/99 (!) 170/99  Pulse: 88 96  Resp:  20  Temp: (!) 36.3 C 36.9 C  SpO2: 96% 94%    Last Pain:  Vitals:   05/31/19 1812  TempSrc: Oral  PainSc:                  [name] S

## 2019-05-31 NOTE — Anesthesia Post-op Follow-up Note (Signed)
Anesthesia QCDR form completed.        

## 2019-06-01 ENCOUNTER — Encounter: Payer: Self-pay | Admitting: Urology

## 2019-06-01 ENCOUNTER — Other Ambulatory Visit: Payer: Self-pay

## 2019-06-01 ENCOUNTER — Ambulatory Visit: Payer: BC Managed Care – PPO | Attending: Radiation Oncology | Admitting: Radiation Oncology

## 2019-06-01 ENCOUNTER — Other Ambulatory Visit: Payer: Self-pay | Admitting: *Deleted

## 2019-06-01 ENCOUNTER — Telehealth: Payer: Self-pay | Admitting: Urology

## 2019-06-01 DIAGNOSIS — C61 Malignant neoplasm of prostate: Secondary | ICD-10-CM | POA: Diagnosis not present

## 2019-06-01 LAB — CBC
HCT: 40.6 % (ref 39.0–52.0)
Hemoglobin: 13.7 g/dL (ref 13.0–17.0)
MCH: 30.2 pg (ref 26.0–34.0)
MCHC: 33.7 g/dL (ref 30.0–36.0)
MCV: 89.6 fL (ref 80.0–100.0)
Platelets: 267 10*3/uL (ref 150–400)
RBC: 4.53 MIL/uL (ref 4.22–5.81)
RDW: 12.2 % (ref 11.5–15.5)
WBC: 14.3 10*3/uL — ABNORMAL HIGH (ref 4.0–10.5)
nRBC: 0 % (ref 0.0–0.2)

## 2019-06-01 LAB — BASIC METABOLIC PANEL WITH GFR
Anion gap: 8 (ref 5–15)
BUN: 15 mg/dL (ref 8–23)
CO2: 24 mmol/L (ref 22–32)
Calcium: 8.7 mg/dL — ABNORMAL LOW (ref 8.9–10.3)
Chloride: 107 mmol/L (ref 98–111)
Creatinine, Ser: 0.98 mg/dL (ref 0.61–1.24)
GFR calc Af Amer: >60 mL/min
GFR calc non Af Amer: >60 mL/min
Glucose, Bld: 131 mg/dL — ABNORMAL HIGH (ref 70–99)
Potassium: 4.3 mmol/L (ref 3.5–5.1)
Sodium: 139 mmol/L (ref 135–145)

## 2019-06-01 MED ORDER — OXYCODONE-ACETAMINOPHEN 5-325 MG PO TABS: 1.0000 | ORAL_TABLET | ORAL | 0 refills | Status: DC | PRN

## 2019-06-01 MED ORDER — DOCUSATE SODIUM 100 MG PO CAPS: 100.0000 mg | ORAL_CAPSULE | Freq: Two times a day (BID) | ORAL | 0 refills | Status: DC

## 2019-06-01 NOTE — Progress Notes (Signed)
Radiation Oncology Follow up Note  Name: Shawn Melton   Date:   05/04/2019 MRN:  [id number] DOB: [birth year]    This 63 y.o. 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 provider found  HPI: Patient is a 63 year old male who I originally consulted back in May 2020 for a T3b N0 M0 Gleason 9 (4+5) adenocarcinoma the prostate presenting with a PSA of 7.1.  We decided decided on attempted prostatectomy with chance of salvage radiation therapy based on his young age and overall general condition.  His attempted prostatectomy was aborted secondary to marked pelvic adhesions making resection impossible.  I have seen him in the hospital room today for discussion of going ahead with radiation therapy.  He is doing well recovering nicely from his attempted surgery..  COMPLICATIONS OF TREATMENT: none  FOLLOW UP COMPLIANCE: keeps appointments   PHYSICAL EXAM:  BP 139/85 (BP Location: Left Arm)   Pulse 69   Temp 98.9 F (37.2 C) (Oral)   Resp 18   SpO2 97%  Well-developed well-nourished patient in NAD. HEENT reveals PERLA, EOMI, discs not visualized.  Oral cavity is clear. No oral mucosal lesions are identified. Neck is clear without evidence of cervical or supraclavicular adenopathy. Lungs are clear to A&P. Cardiac examination is essentially unremarkable with regular rate and rhythm without murmur rub or thrill. Abdomen is benign with no organomegaly or masses noted. Motor sensory and DTR levels are equal and symmetric in the upper and lower extremities. Cranial nerves II through XII are grossly intact. Proprioception is intact. No peripheral adenopathy or edema is identified. No motor or sensory levels are noted. Crude visual fields are within normal range.  RADIOLOGY RESULTS: No current films for review  PLAN: Present time like to go ahead with a  radiation therapy.  We would start out with IMRT radiation therapy to his prostate and pelvic nodes up to 5000 cGy over 5 weeks.  I would also boost his prostate with I-125 interstitial implant after completion of external beam radiation.  Patient also will start androgen deprivation therapy for at least 2 years under urology's direction.  Risks and benefits of radiation therapy were discussed with the patient side effects including increased lower urinary tract symptoms diarrhea fatigue alteration of blood counts all were discussed in detail with the patient.  He seems to comprehend my treatment plan well.  I have set him up in about a week's time for CT simulation he will also be starting next week's androgen deprivation therapy.  Patient comprehends my treatment plan well.  I would like to take this opportunity to thank you for allowing me to participate in the care of your patient.Noreene Filbert, MD

## 2019-06-01 NOTE — Telephone Encounter (Signed)
This patient underwent attempted prostatectomy yesterday but had to be aborted due to severe pelvic adhesions.  Lengthy discussion with the patient today, he has agreed to IMRT with brachyboost as well as ADT x2 to 3 years.  Risk benefits of ADT were reviewed again in detail.  He will be discharged today from the hospital.  I like him to come in sometime next week for a Lupron injection x4 months Depo.  Dr. Baruch Gouty, can you arrange for him to be seen ASAP to start the process of radiation planning?  Hollice Espy, MD

## 2019-06-01 NOTE — Discharge Instructions (Signed)
·   Activity:  You are encouraged to ambulate frequently (about every hour during waking hours) to help prevent blood clots from forming in your legs or lungs.  However, you should not engage in any heavy lifting (> 5-10 lbs), strenuous activity, or straining. ° °· Diet: You should advance your diet as instructed by your physician.  It will be normal to have some bloating, nausea, and abdominal discomfort intermittently. ° °· Prescriptions:  You will be provided a prescription for pain medication to take as needed.  If your pain is not severe enough to require the prescription pain medication, you may take extra strength Tylenol instead which will have less side effects.  You should also take a prescribed stool softener to avoid straining with bowel movements as the prescription pain medication may constipate you. ° °· Incisions: You may remove your dressing bandages 48 hours after surgery if not removed in the hospital.  You will either have some small staples or special tissue glue at each of the incision sites. Once the bandages are removed (if present), the incisions may stay open to air.  You may start showering (but not soaking or bathing in water) the 2nd day after surgery and the incisions simply need to be patted dry after the shower.  No additional care is needed. ° °What to call us about: You should call the office if you develop fever > 101 or develop persistent vomiting, redness or draining around your incision, or any other concerning symptoms.   ° °[facility] °[address], [address] °[place], [place] [address] °[phone] ° ° °

## 2019-06-01 NOTE — Discharge Summary (Signed)
Date of admission: 05/31/2019  Date of discharge: 06/01/2019  Admission diagnosis: Prostate cancer  Discharge diagnosis: same  Secondary diagnoses:  Patient Active Problem List   Diagnosis Date Noted  . Prostate cancer ([place]) 05/31/2019  . Gastroesophageal reflux disease without esophagitis 12/10/2017  . Personal history of other malignant neoplasm of skin 07/07/2017  . BRBPR (bright red blood per rectum) 01/05/2016  . History of ischemic colitis 01/05/2016  . Mucus in stool 01/05/2016  . Ankle pain 11/30/2015  . Arthritis, degenerative 11/30/2015  . Family history of colon cancer 11/30/2015  . Family history of malignant neoplasm of prostate 11/03/2015    History and Physical: For full details, please see admission history and physical. Briefly, Shawn Melton is a 63 y.o. year old patient with advanced prostate cancer who is taken to the operating room for attempted prostatectomy which was aborted.  He did undergo left lymph node dissection.  He was mended overnight for observation.Marland Kitchen   Hospital Course: Patient tolerated the procedure well.  He was then transferred to the floor after an uneventful PACU stay.  His hospital course was uncomplicated.  On POD#1 he had met discharge criteria: was eating a regular diet, was up and ambulating independently,  pain was well controlled, was voiding without a catheter, and was ready to for discharge.  Physical Exam Vitals signs reviewed.  Constitutional:      Appearance: Normal appearance.  HENT:     Head: Normocephalic.  Cardiovascular:     Rate and Rhythm: Normal rate.  Abdominal:     General: There is no distension.     Palpations: Abdomen is soft.     Comments: Incisions clean dry and intact  Genitourinary:    Comments: Urine clear yellow urine Skin:    General: Skin is warm and dry.     Capillary Refill: Capillary refill takes less than 2 seconds.  Neurological:     General: No focal deficit present.     Mental Status: He is  alert and oriented to person, place, and time.  Psychiatric:        Mood and Affect: Mood normal.        Behavior: Behavior normal.      Laboratory values:  Recent Labs    06/01/19 0400  WBC 14.3*  HGB 13.7  HCT 40.6   Recent Labs    06/01/19 0400  NA 139  K 4.3  CL 107  CO2 24  GLUCOSE 131*  BUN 15  CREATININE 0.98  CALCIUM 8.7*   No results for input(s): LABPT, INR in the last 72 hours. No results for input(s): LABURIN in the last 72 hours. Results for orders placed or performed during the hospital encounter of 05/27/19  Novel Coronavirus, NAA (hospital order; send-out to ref lab)     Status: None   Collection Time: 05/27/19  9:10 AM   Specimen: Nasopharyngeal Swab; Respiratory  Result Value Ref Range Status   SARS-CoV-2, NAA NOT DETECTED NOT DETECTED Final    Comment: (NOTE) This test was developed and its performance characteristics determined by [employer]. This test has not been FDA cleared or approved. This test has been authorized by FDA under an Emergency Use Authorization (EUA). This test is only authorized for the duration of time the declaration that circumstances exist justifying the authorization of the emergency use of in vitro diagnostic tests for detection of SARS-CoV-2 virus and/or diagnosis of COVID-19 infection under section 564(b)(1) of the Act, 21 U.S.C. 841YSA-6(T)(0), unless the authorization  is terminated or revoked sooner. When diagnostic testing is negative, the possibility of a false negative result should be considered in the context of a patient's recent exposures and the presence of clinical signs and symptoms consistent with COVID-19. An individual without symptoms of COVID-19 and who is not shedding SARS-CoV-2 virus would expect to have a negative (not detected) result in this assay. Performed  At: [facility] [address] [place], [place] [id number] Rush Farmer MD JS:[id number]    Parc  Final    Comment: Performed at [facility], [address] [place], [place] [address]    Disposition: Home  Discharge instruction: The patient was instructed to be ambulatory but told to refrain from heavy lifting, strenuous activity, or driving.  Discharge medications:  Allergies as of 06/01/2019      Reactions   Neosporin [bacitracin-polymyxin B] Itching, Other (See Comments)   redness      Medication List    TAKE these medications   ALPRAZolam 0.5 MG tablet Commonly known as: XANAX Take 0.25 mg by mouth at bedtime as needed for sleep.   docusate sodium 100 MG capsule Commonly known as: COLACE Take 1 capsule (100 mg total) by mouth 2 (two) times daily.   losartan 50 MG tablet Commonly known as: COZAAR Take 50 mg by mouth at bedtime.   omeprazole 40 MG capsule Commonly known as: PRILOSEC TAKE ONE CAPSULE BY MOUTH EVERY DAY   One-A-Day Mens 50+ Advantage Tabs Take 1 tablet by mouth daily.   oxyCODONE-acetaminophen 5-325 MG tablet Commonly known as: Percocet Take 1-2 tablets by mouth every 4 (four) hours as needed for moderate pain or severe pain.       Followup:  Follow-up Information    Hollice Espy, MD.   Specialty: Urology Why: 1 month see Dr. Erlene Quan with PSA prior (July 02, 2019 @ 9:00 am is the [facility] appt and Dr. Erlene Quan appt. is 07/05/13 @ 3:45 pm)  Contact information: [place] [address] [phone]

## 2019-06-01 NOTE — Progress Notes (Signed)
Shawn Melton to be D/C'd Home per MD order.  Discussed prescriptions and follow up appointments with the patient. Prescriptions given to patient, medication list explained in detail. Pt verbalized understanding.  Allergies as of 06/01/2019      Reactions   Neosporin [bacitracin-polymyxin B] Itching, Other (See Comments)   redness      Medication List    TAKE these medications   ALPRAZolam 0.5 MG tablet Commonly known as: XANAX Take 0.25 mg by mouth at bedtime as needed for sleep.   docusate sodium 100 MG capsule Commonly known as: COLACE Take 1 capsule (100 mg total) by mouth 2 (two) times daily.   losartan 50 MG tablet Commonly known as: COZAAR Take 50 mg by mouth at bedtime.   omeprazole 40 MG capsule Commonly known as: PRILOSEC TAKE ONE CAPSULE BY MOUTH EVERY DAY   One-A-Day Mens 50+ Advantage Tabs Take 1 tablet by mouth daily.   oxyCODONE-acetaminophen 5-325 MG tablet Commonly known as: Percocet Take 1-2 tablets by mouth every 4 (four) hours as needed for moderate pain or severe pain.       Vitals:   06/01/19 0449 06/01/19 1151  BP: 113/66 139/85  Pulse: 74 69  Resp: 18 18  Temp: 98.4 F (36.9 C) 98.9 F (37.2 C)  SpO2: 95% 97%    Skin clean, dry and intact without evidence of skin break down, no evidence of skin tears noted. IV catheter discontinued intact. Site without signs and symptoms of complications. Dressing and pressure applied. Pt denies pain at this time. No complaints noted.  An After Visit Summary was printed and given to the patient. Patient escorted via [place], and D/C home via private auto.  Fuller Mandril, RN

## 2019-06-02 LAB — SURGICAL PATHOLOGY

## 2019-06-07 ENCOUNTER — Ambulatory Visit: Payer: BC Managed Care – PPO

## 2019-06-07 NOTE — Telephone Encounter (Signed)
Prior authorization request for the Lupron injection faxed on 06/07/19.  We are pending authorization.  I have advised the patient of the status and that we will call him to get him set up for a Lupron injection this week as soon as we receive the authorization.

## 2019-06-08 NOTE — Telephone Encounter (Signed)
Prior authorization approved for Lupron injection.  Patient notified and scheduled for nurse visit on 06/09/19.

## 2019-06-09 ENCOUNTER — Ambulatory Visit (INDEPENDENT_AMBULATORY_CARE_PROVIDER_SITE_OTHER): Payer: BC Managed Care – PPO

## 2019-06-09 ENCOUNTER — Other Ambulatory Visit: Payer: Self-pay

## 2019-06-09 VITALS — BP 146/88 | HR 76 | Ht 66.0 in | Wt 190.0 lb

## 2019-06-09 DIAGNOSIS — C61 Malignant neoplasm of prostate: Secondary | ICD-10-CM

## 2019-06-09 MED ORDER — LEUPROLIDE ACETATE (6 MONTH) 45 MG IM KIT
45.0000 mg | PACK | Freq: Once | INTRAMUSCULAR | Status: AC
  Administered 2019-06-09: 45 mg via INTRAMUSCULAR

## 2019-06-09 NOTE — Patient Instructions (Signed)
Start taking Calcium and Vitamin D.  Leuprolide depot injection What is this medicine? LEUPROLIDE (loo PROE lide) is a man-made protein that acts like a natural hormone in the body. It decreases testosterone in men and decreases estrogen in women. In men, this medicine is used to treat advanced prostate cancer. In women, some forms of this medicine may be used to treat endometriosis, uterine fibroids, or other male hormone-related problems. This medicine may be used for other purposes; ask your health care provider or pharmacist if you have questions. COMMON BRAND NAME(S): Eligard, Fensolv, Lupron Depot, Lupron Depot-Ped, Viadur What should I tell my health care provider before I take this medicine? They need to know if you have any of these conditions:  diabetes  heart disease or previous heart attack  high blood pressure  high cholesterol  mental illness  osteoporosis  pain or difficulty passing urine  seizures  spinal cord metastasis  stroke  suicidal thoughts, plans, or attempt; a previous suicide attempt by you or a family member  tobacco smoker  unusual vaginal bleeding (women)  an unusual or allergic reaction to leuprolide, benzyl alcohol, other medicines, foods, dyes, or preservatives  pregnant or trying to get pregnant  breast-feeding How should I use this medicine? This medicine is for injection into a muscle or for injection under the skin. It is given by a health care professional in a hospital or clinic setting. The specific product will determine how it will be given to you. Make sure you understand which product you receive and how often you will receive it. Talk to your pediatrician regarding the use of this medicine in children. Special care may be needed. Overdosage: If you think you have taken too much of this medicine contact a poison control center or emergency room at once. NOTE: This medicine is only for you. Do not share this medicine with  others. What if I miss a dose? It is important not to miss a dose. Call your doctor or health care professional if you are unable to keep an appointment. Depot injections: Depot injections are given either once-monthly, every 12 weeks, every 16 weeks, or every 24 weeks depending on the product you are prescribed. The product you are prescribed will be based on if you are male or male, and your condition. Make sure you understand your product and dosing. What may interact with this medicine? Do not take this medicine with any of the following medications:  chasteberry This medicine may also interact with the following medications:  herbal or dietary supplements, like black cohosh or DHEA  male hormones, like estrogens or progestins and birth control pills, patches, rings, or injections  male hormones, like testosterone This list may not describe all possible interactions. Give your health care provider a list of all the medicines, herbs, non-prescription drugs, or dietary supplements you use. Also tell them if you smoke, drink alcohol, or use illegal drugs. Some items may interact with your medicine. What should I watch for while using this medicine? Visit your doctor or health care professional for regular checks on your progress. During the first weeks of treatment, your symptoms may get worse, but then will improve as you continue your treatment. You may get hot flashes, increased bone pain, increased difficulty passing urine, or an aggravation of nerve symptoms. Discuss these effects with your doctor or health care professional, some of them may improve with continued use of this medicine. Male patients may experience a menstrual cycle or spotting during the  first months of therapy with this medicine. If this continues, contact your doctor or health care professional. This medicine may increase blood sugar. Ask your healthcare provider if changes in diet or medicines are needed if you  have diabetes. What side effects may I notice from receiving this medicine? Side effects that you should report to your doctor or health care professional as soon as possible:  allergic reactions like skin rash, itching or hives, swelling of the face, lips, or tongue  breathing problems  chest pain  depression or memory disorders  pain in your legs or groin  pain at site where injected or implanted  seizures  severe headache  signs and symptoms of high blood sugar such as being more thirsty or hungry or having to urinate more than normal. You may also feel very tired or have blurry vision  swelling of the feet and legs  suicidal thoughts or other mood changes  visual changes  vomiting Side effects that usually do not require medical attention (report to your doctor or health care professional if they continue or are bothersome):  breast swelling or tenderness  decrease in sex drive or performance  diarrhea  hot flashes  loss of appetite  muscle, joint, or bone pains  nausea  redness or irritation at site where injected or implanted  skin problems or acne This list may not describe all possible side effects. Call your doctor for medical advice about side effects. You may report side effects to FDA at 1-800-FDA-1088. Where should I keep my medicine? This drug is given in a hospital or clinic and will not be stored at home. NOTE: This sheet is a summary. It may not cover all possible information. If you have questions about this medicine, talk to your doctor, pharmacist, or health care provider.  2020 Elsevier/Gold Standard (2018-09-17 09:27:03)

## 2019-06-09 NOTE — Progress Notes (Signed)
Lupron IM Injection   Due to Prostate Cancer patient is present today for a Lupron Injection.  Medication: Lupron 6 month Dose: 45 mg  Location: left upper outer buttocks Lot: [id number] Exp: 07/13/21  Dr. Erlene Quan came in and thoroughly went over patient's questions and concerns prior to injection.  Patient tolerated well, no complications were noted   Performed by: Shawnie Dapper, CMA  Follow up: as scheduled for lab work and f/u with Dr Erlene Quan

## 2019-06-14 ENCOUNTER — Ambulatory Visit
Admission: RE | Admit: 2019-06-14 | Discharge: 2019-06-14 | Disposition: A | Discharge status: Home / Self Care | Payer: BC Managed Care – PPO | Source: Ambulatory Visit | Attending: Radiation Oncology | Admitting: Radiation Oncology

## 2019-06-14 ENCOUNTER — Other Ambulatory Visit: Payer: Self-pay

## 2019-06-14 DIAGNOSIS — C61 Malignant neoplasm of prostate: Secondary | ICD-10-CM | POA: Diagnosis present

## 2019-06-14 DIAGNOSIS — Z510 Encounter for antineoplastic radiation therapy: Secondary | ICD-10-CM | POA: Insufficient documentation

## 2019-06-15 ENCOUNTER — Telehealth: Payer: Self-pay | Admitting: Urology

## 2019-06-15 DIAGNOSIS — C61 Malignant neoplasm of prostate: Secondary | ICD-10-CM | POA: Diagnosis not present

## 2019-06-15 NOTE — Telephone Encounter (Signed)
Pt states that one of his incision sites is having some sharpe pain and stinging when he moves a certain way he also states that it has some redness around it. Please advise.

## 2019-06-15 NOTE — Telephone Encounter (Signed)
Spoke with patient he states that his Right side incision , has occasional clear/pink fluid drainage but very little, and sharpe pain only with certain movements , denies fever and chills and no puss noted at site. Patient was told to utilize NSAIDs for pain and to monitor for now and let us know if symptoms worsen

## 2019-06-17 ENCOUNTER — Other Ambulatory Visit: Payer: Self-pay | Admitting: *Deleted

## 2019-06-17 DIAGNOSIS — C61 Malignant neoplasm of prostate: Secondary | ICD-10-CM

## 2019-06-23 ENCOUNTER — Other Ambulatory Visit: Payer: Self-pay

## 2019-06-23 ENCOUNTER — Ambulatory Visit
Admission: RE | Admit: 2019-06-23 | Discharge: 2019-06-23 | Disposition: A | Discharge status: Home / Self Care | Payer: BC Managed Care – PPO | Source: Ambulatory Visit | Attending: Radiation Oncology | Admitting: Radiation Oncology

## 2019-06-24 ENCOUNTER — Other Ambulatory Visit: Payer: Self-pay

## 2019-06-24 ENCOUNTER — Ambulatory Visit
Admission: RE | Admit: 2019-06-24 | Discharge: 2019-06-24 | Disposition: A | Discharge status: Home / Self Care | Payer: BC Managed Care – PPO | Source: Ambulatory Visit | Attending: Radiation Oncology | Admitting: Radiation Oncology

## 2019-06-24 DIAGNOSIS — C61 Malignant neoplasm of prostate: Secondary | ICD-10-CM | POA: Diagnosis not present

## 2019-06-25 ENCOUNTER — Ambulatory Visit
Admission: RE | Admit: 2019-06-25 | Discharge: 2019-06-25 | Disposition: A | Discharge status: Home / Self Care | Payer: BC Managed Care – PPO | Source: Ambulatory Visit | Attending: Radiation Oncology | Admitting: Radiation Oncology

## 2019-06-25 ENCOUNTER — Other Ambulatory Visit: Payer: Self-pay

## 2019-06-25 DIAGNOSIS — C61 Malignant neoplasm of prostate: Secondary | ICD-10-CM | POA: Diagnosis not present

## 2019-06-28 ENCOUNTER — Other Ambulatory Visit: Payer: Self-pay

## 2019-06-28 ENCOUNTER — Ambulatory Visit
Admission: RE | Admit: 2019-06-28 | Discharge: 2019-06-28 | Disposition: A | Discharge status: Home / Self Care | Payer: BC Managed Care – PPO | Source: Ambulatory Visit | Attending: Radiation Oncology | Admitting: Radiation Oncology

## 2019-06-28 DIAGNOSIS — C61 Malignant neoplasm of prostate: Secondary | ICD-10-CM | POA: Diagnosis not present

## 2019-06-29 ENCOUNTER — Ambulatory Visit
Admission: RE | Admit: 2019-06-29 | Discharge: 2019-06-29 | Disposition: A | Discharge status: Home / Self Care | Payer: BC Managed Care – PPO | Source: Ambulatory Visit | Attending: Radiation Oncology | Admitting: Radiation Oncology

## 2019-06-29 ENCOUNTER — Other Ambulatory Visit: Payer: Self-pay

## 2019-06-29 DIAGNOSIS — C61 Malignant neoplasm of prostate: Secondary | ICD-10-CM | POA: Diagnosis not present

## 2019-06-30 ENCOUNTER — Other Ambulatory Visit: Payer: Self-pay

## 2019-06-30 ENCOUNTER — Ambulatory Visit
Admission: RE | Admit: 2019-06-30 | Discharge: 2019-06-30 | Disposition: A | Discharge status: Home / Self Care | Payer: BC Managed Care – PPO | Source: Ambulatory Visit | Attending: Radiation Oncology | Admitting: Radiation Oncology

## 2019-06-30 DIAGNOSIS — C61 Malignant neoplasm of prostate: Secondary | ICD-10-CM | POA: Diagnosis not present

## 2019-07-01 ENCOUNTER — Other Ambulatory Visit: Payer: Self-pay

## 2019-07-01 ENCOUNTER — Ambulatory Visit
Admission: RE | Admit: 2019-07-01 | Discharge: 2019-07-01 | Disposition: A | Discharge status: Home / Self Care | Payer: BC Managed Care – PPO | Source: Ambulatory Visit | Attending: Radiation Oncology | Admitting: Radiation Oncology

## 2019-07-01 DIAGNOSIS — C61 Malignant neoplasm of prostate: Secondary | ICD-10-CM

## 2019-07-02 ENCOUNTER — Other Ambulatory Visit: Payer: Self-pay

## 2019-07-02 ENCOUNTER — Ambulatory Visit
Admission: RE | Admit: 2019-07-02 | Discharge: 2019-07-02 | Disposition: A | Discharge status: Home / Self Care | Payer: BC Managed Care – PPO | Source: Ambulatory Visit | Attending: Radiation Oncology | Admitting: Radiation Oncology

## 2019-07-02 ENCOUNTER — Other Ambulatory Visit: Payer: BC Managed Care – PPO

## 2019-07-02 DIAGNOSIS — C61 Malignant neoplasm of prostate: Secondary | ICD-10-CM

## 2019-07-03 LAB — PSA: Prostate Specific Ag, Serum: 22.7 ng/mL — ABNORMAL HIGH (ref 0.0–4.0)

## 2019-07-05 ENCOUNTER — Inpatient Hospital Stay: Payer: BC Managed Care – PPO | Attending: Radiation Oncology

## 2019-07-05 ENCOUNTER — Other Ambulatory Visit: Payer: Self-pay

## 2019-07-05 ENCOUNTER — Telehealth: Payer: Self-pay | Admitting: *Deleted

## 2019-07-05 ENCOUNTER — Ambulatory Visit
Admission: RE | Admit: 2019-07-05 | Discharge: 2019-07-05 | Disposition: A | Discharge status: Home / Self Care | Payer: BC Managed Care – PPO | Source: Ambulatory Visit | Attending: Radiation Oncology | Admitting: Radiation Oncology

## 2019-07-05 DIAGNOSIS — Z510 Encounter for antineoplastic radiation therapy: Secondary | ICD-10-CM | POA: Insufficient documentation

## 2019-07-05 DIAGNOSIS — C61 Malignant neoplasm of prostate: Secondary | ICD-10-CM

## 2019-07-05 LAB — CBC
HCT: 38.8 % — ABNORMAL LOW (ref 39.0–52.0)
Hemoglobin: 13.5 g/dL (ref 13.0–17.0)
MCH: 30.5 pg (ref 26.0–34.0)
MCHC: 34.8 g/dL (ref 30.0–36.0)
MCV: 87.6 fL (ref 80.0–100.0)
Platelets: 251 10*3/uL (ref 150–400)
RBC: 4.43 MIL/uL (ref 4.22–5.81)
RDW: 12 % (ref 11.5–15.5)
WBC: 6.6 10*3/uL (ref 4.0–10.5)
nRBC: 0 % (ref 0.0–0.2)

## 2019-07-05 NOTE — Telephone Encounter (Addendum)
  Left message on VM to return call  ----- Message from Hollice Espy, MD sent at 07/05/2019  7:41 AM EDT ----- This patient never had his prostate removed.  The case was aborted and he is now receiving ADT and undergoing radiation.  His lab was inadvertently drawn as this was a leftover appointment from when he was supposed to have a prostatectomy.  Same at tomorrow's appointment.  No reason to see tomorrow, please reschedule for in 3 months to check on his urinary symptoms.  His PSA is not clinically meaningful while undergoing radiation.  Hollice Espy, MD

## 2019-07-05 NOTE — Telephone Encounter (Signed)
Spoke with patient informed per Dr. Herma Ard understanding.

## 2019-07-05 NOTE — Telephone Encounter (Signed)
Pt called and states that he would like to speak with Dr Erlene Quan concerning his PSA levels, he states that it went up to 22. Please advise.

## 2019-07-06 ENCOUNTER — Ambulatory Visit: Payer: BC Managed Care – PPO | Admitting: Urology

## 2019-07-06 ENCOUNTER — Other Ambulatory Visit: Payer: Self-pay | Admitting: Radiology

## 2019-07-06 ENCOUNTER — Ambulatory Visit
Admission: RE | Admit: 2019-07-06 | Discharge: 2019-07-06 | Disposition: A | Discharge status: Home / Self Care | Payer: BC Managed Care – PPO | Source: Ambulatory Visit | Attending: Radiation Oncology | Admitting: Radiation Oncology

## 2019-07-06 ENCOUNTER — Other Ambulatory Visit: Payer: Self-pay

## 2019-07-06 DIAGNOSIS — C61 Malignant neoplasm of prostate: Secondary | ICD-10-CM | POA: Diagnosis not present

## 2019-07-07 ENCOUNTER — Ambulatory Visit
Admission: RE | Admit: 2019-07-07 | Discharge: 2019-07-07 | Disposition: A | Discharge status: Home / Self Care | Payer: BC Managed Care – PPO | Source: Ambulatory Visit | Attending: Radiation Oncology | Admitting: Radiation Oncology

## 2019-07-07 ENCOUNTER — Other Ambulatory Visit: Payer: Self-pay

## 2019-07-07 DIAGNOSIS — C61 Malignant neoplasm of prostate: Secondary | ICD-10-CM | POA: Diagnosis not present

## 2019-07-08 ENCOUNTER — Other Ambulatory Visit: Payer: Self-pay

## 2019-07-08 ENCOUNTER — Ambulatory Visit
Admission: RE | Admit: 2019-07-08 | Discharge: 2019-07-08 | Disposition: A | Discharge status: Home / Self Care | Payer: BC Managed Care – PPO | Source: Ambulatory Visit | Attending: Radiation Oncology | Admitting: Radiation Oncology

## 2019-07-08 DIAGNOSIS — C61 Malignant neoplasm of prostate: Secondary | ICD-10-CM | POA: Diagnosis not present

## 2019-07-09 ENCOUNTER — Other Ambulatory Visit: Payer: Self-pay

## 2019-07-09 ENCOUNTER — Ambulatory Visit
Admission: RE | Admit: 2019-07-09 | Discharge: 2019-07-09 | Disposition: A | Discharge status: Home / Self Care | Payer: BC Managed Care – PPO | Source: Ambulatory Visit | Attending: Radiation Oncology | Admitting: Radiation Oncology

## 2019-07-09 DIAGNOSIS — C61 Malignant neoplasm of prostate: Secondary | ICD-10-CM | POA: Diagnosis not present

## 2019-07-12 ENCOUNTER — Other Ambulatory Visit: Payer: Self-pay

## 2019-07-12 ENCOUNTER — Ambulatory Visit
Admission: RE | Admit: 2019-07-12 | Discharge: 2019-07-12 | Disposition: A | Discharge status: Home / Self Care | Payer: BC Managed Care – PPO | Source: Ambulatory Visit | Attending: Radiation Oncology | Admitting: Radiation Oncology

## 2019-07-12 DIAGNOSIS — C61 Malignant neoplasm of prostate: Secondary | ICD-10-CM | POA: Diagnosis not present

## 2019-07-13 ENCOUNTER — Ambulatory Visit
Admission: RE | Admit: 2019-07-13 | Discharge: 2019-07-13 | Disposition: A | Discharge status: Home / Self Care | Payer: BC Managed Care – PPO | Source: Ambulatory Visit | Attending: Radiation Oncology | Admitting: Radiation Oncology

## 2019-07-13 ENCOUNTER — Other Ambulatory Visit: Payer: Self-pay

## 2019-07-13 DIAGNOSIS — C61 Malignant neoplasm of prostate: Secondary | ICD-10-CM | POA: Diagnosis not present

## 2019-07-14 ENCOUNTER — Other Ambulatory Visit: Payer: Self-pay

## 2019-07-14 ENCOUNTER — Ambulatory Visit
Admission: RE | Admit: 2019-07-14 | Discharge: 2019-07-14 | Disposition: A | Discharge status: Home / Self Care | Payer: BC Managed Care – PPO | Source: Ambulatory Visit | Attending: Radiation Oncology | Admitting: Radiation Oncology

## 2019-07-14 DIAGNOSIS — C61 Malignant neoplasm of prostate: Secondary | ICD-10-CM | POA: Diagnosis not present

## 2019-07-15 ENCOUNTER — Other Ambulatory Visit: Payer: Self-pay

## 2019-07-15 ENCOUNTER — Ambulatory Visit
Admission: RE | Admit: 2019-07-15 | Discharge: 2019-07-15 | Disposition: A | Discharge status: Home / Self Care | Payer: BC Managed Care – PPO | Source: Ambulatory Visit | Attending: Radiation Oncology | Admitting: Radiation Oncology

## 2019-07-15 DIAGNOSIS — C61 Malignant neoplasm of prostate: Secondary | ICD-10-CM | POA: Diagnosis not present

## 2019-07-16 ENCOUNTER — Ambulatory Visit
Admission: RE | Admit: 2019-07-16 | Discharge: 2019-07-16 | Disposition: A | Discharge status: Home / Self Care | Payer: BC Managed Care – PPO | Source: Ambulatory Visit | Attending: Radiation Oncology | Admitting: Radiation Oncology

## 2019-07-16 ENCOUNTER — Other Ambulatory Visit: Payer: Self-pay

## 2019-07-16 DIAGNOSIS — C61 Malignant neoplasm of prostate: Secondary | ICD-10-CM | POA: Diagnosis not present

## 2019-07-19 ENCOUNTER — Other Ambulatory Visit: Payer: Self-pay

## 2019-07-19 ENCOUNTER — Inpatient Hospital Stay: Payer: BC Managed Care – PPO

## 2019-07-19 ENCOUNTER — Ambulatory Visit
Admission: RE | Admit: 2019-07-19 | Discharge: 2019-07-19 | Disposition: A | Discharge status: Home / Self Care | Payer: BC Managed Care – PPO | Source: Ambulatory Visit | Attending: Radiation Oncology | Admitting: Radiation Oncology

## 2019-07-19 DIAGNOSIS — C61 Malignant neoplasm of prostate: Secondary | ICD-10-CM

## 2019-07-19 LAB — CBC
HCT: 38.1 % — ABNORMAL LOW (ref 39.0–52.0)
Hemoglobin: 13.1 g/dL (ref 13.0–17.0)
MCH: 30.3 pg (ref 26.0–34.0)
MCHC: 34.4 g/dL (ref 30.0–36.0)
MCV: 88 fL (ref 80.0–100.0)
Platelets: 218 10*3/uL (ref 150–400)
RBC: 4.33 MIL/uL (ref 4.22–5.81)
RDW: 11.9 % (ref 11.5–15.5)
WBC: 6.7 10*3/uL (ref 4.0–10.5)
nRBC: 0 % (ref 0.0–0.2)

## 2019-07-20 ENCOUNTER — Other Ambulatory Visit: Payer: Self-pay

## 2019-07-20 ENCOUNTER — Ambulatory Visit
Admission: RE | Admit: 2019-07-20 | Discharge: 2019-07-20 | Disposition: A | Discharge status: Home / Self Care | Payer: BC Managed Care – PPO | Source: Ambulatory Visit | Attending: Radiation Oncology | Admitting: Radiation Oncology

## 2019-07-20 DIAGNOSIS — C61 Malignant neoplasm of prostate: Secondary | ICD-10-CM | POA: Diagnosis not present

## 2019-07-21 ENCOUNTER — Ambulatory Visit
Admission: RE | Admit: 2019-07-21 | Discharge: 2019-07-21 | Disposition: A | Discharge status: Home / Self Care | Payer: BC Managed Care – PPO | Source: Ambulatory Visit | Attending: Radiation Oncology | Admitting: Radiation Oncology

## 2019-07-21 ENCOUNTER — Other Ambulatory Visit: Payer: Self-pay

## 2019-07-21 DIAGNOSIS — C61 Malignant neoplasm of prostate: Secondary | ICD-10-CM | POA: Diagnosis not present

## 2019-07-22 ENCOUNTER — Other Ambulatory Visit: Payer: Self-pay

## 2019-07-22 ENCOUNTER — Ambulatory Visit
Admission: RE | Admit: 2019-07-22 | Discharge: 2019-07-22 | Disposition: A | Discharge status: Home / Self Care | Payer: BC Managed Care – PPO | Source: Ambulatory Visit | Attending: Radiation Oncology | Admitting: Radiation Oncology

## 2019-07-22 DIAGNOSIS — C61 Malignant neoplasm of prostate: Secondary | ICD-10-CM | POA: Diagnosis not present

## 2019-07-23 ENCOUNTER — Ambulatory Visit
Admission: RE | Admit: 2019-07-23 | Discharge: 2019-07-23 | Disposition: A | Discharge status: Home / Self Care | Payer: BC Managed Care – PPO | Source: Ambulatory Visit | Attending: Radiation Oncology | Admitting: Radiation Oncology

## 2019-07-23 ENCOUNTER — Other Ambulatory Visit: Payer: Self-pay

## 2019-07-23 DIAGNOSIS — C61 Malignant neoplasm of prostate: Secondary | ICD-10-CM | POA: Diagnosis not present

## 2019-07-26 ENCOUNTER — Ambulatory Visit
Admission: RE | Admit: 2019-07-26 | Discharge: 2019-07-26 | Disposition: A | Discharge status: Home / Self Care | Payer: BC Managed Care – PPO | Source: Ambulatory Visit | Attending: Radiation Oncology | Admitting: Radiation Oncology

## 2019-07-26 ENCOUNTER — Other Ambulatory Visit: Payer: Self-pay

## 2019-07-26 DIAGNOSIS — C61 Malignant neoplasm of prostate: Secondary | ICD-10-CM | POA: Diagnosis not present

## 2019-07-27 ENCOUNTER — Other Ambulatory Visit: Payer: Self-pay

## 2019-07-27 ENCOUNTER — Ambulatory Visit
Admission: RE | Admit: 2019-07-27 | Discharge: 2019-07-27 | Disposition: A | Discharge status: Home / Self Care | Payer: BC Managed Care – PPO | Source: Ambulatory Visit | Attending: Radiation Oncology | Admitting: Radiation Oncology

## 2019-07-27 DIAGNOSIS — C61 Malignant neoplasm of prostate: Secondary | ICD-10-CM | POA: Diagnosis not present

## 2019-07-28 ENCOUNTER — Other Ambulatory Visit: Payer: Self-pay

## 2019-07-28 ENCOUNTER — Ambulatory Visit
Admission: RE | Admit: 2019-07-28 | Discharge: 2019-07-28 | Disposition: A | Discharge status: Home / Self Care | Payer: BC Managed Care – PPO | Source: Ambulatory Visit | Attending: Radiation Oncology | Admitting: Radiation Oncology

## 2019-07-28 DIAGNOSIS — C61 Malignant neoplasm of prostate: Secondary | ICD-10-CM | POA: Diagnosis not present

## 2019-07-30 ENCOUNTER — Other Ambulatory Visit: Payer: Self-pay

## 2019-07-30 ENCOUNTER — Other Ambulatory Visit
Admission: RE | Admit: 2019-07-30 | Discharge: 2019-07-30 | Disposition: A | Discharge status: Home / Self Care | Payer: BC Managed Care – PPO | Source: Ambulatory Visit | Attending: Radiation Oncology | Admitting: Radiation Oncology

## 2019-07-30 DIAGNOSIS — Z20828 Contact with and (suspected) exposure to other viral communicable diseases: Secondary | ICD-10-CM | POA: Diagnosis not present

## 2019-07-30 DIAGNOSIS — Z01812 Encounter for preprocedural laboratory examination: Secondary | ICD-10-CM | POA: Insufficient documentation

## 2019-07-30 LAB — SARS CORONAVIRUS 2 (TAT 6-24 HRS): SARS Coronavirus 2: NEGATIVE

## 2019-08-03 ENCOUNTER — Encounter
Admission: RE | Disposition: A | Discharge status: Home / Self Care | Payer: Self-pay | Source: Home / Self Care | Attending: Radiation Oncology

## 2019-08-03 ENCOUNTER — Ambulatory Visit
Admission: RE | Admit: 2019-08-03 | Discharge: 2019-08-03 | Disposition: A | Discharge status: Home / Self Care | Payer: BC Managed Care – PPO | Attending: Radiation Oncology | Admitting: Radiation Oncology

## 2019-08-03 ENCOUNTER — Encounter: Payer: Self-pay | Admitting: Radiation Oncology

## 2019-08-03 ENCOUNTER — Other Ambulatory Visit: Payer: Self-pay

## 2019-08-03 ENCOUNTER — Ambulatory Visit
Admission: RE | Admit: 2019-08-03 | Discharge: 2019-08-03 | Disposition: A | Discharge status: Home / Self Care | Payer: BC Managed Care – PPO | Source: Ambulatory Visit | Attending: Radiation Oncology | Admitting: Radiation Oncology

## 2019-08-03 ENCOUNTER — Encounter: Payer: Self-pay | Admitting: Anesthesiology

## 2019-08-03 VITALS — BP 163/107 | HR 73 | Temp 97.1°F | Wt 193.0 lb

## 2019-08-03 DIAGNOSIS — C61 Malignant neoplasm of prostate: Secondary | ICD-10-CM | POA: Diagnosis present

## 2019-08-03 DIAGNOSIS — E669 Obesity, unspecified: Secondary | ICD-10-CM | POA: Insufficient documentation

## 2019-08-03 DIAGNOSIS — Z79899 Other long term (current) drug therapy: Secondary | ICD-10-CM | POA: Insufficient documentation

## 2019-08-03 DIAGNOSIS — I10 Essential (primary) hypertension: Secondary | ICD-10-CM | POA: Insufficient documentation

## 2019-08-03 DIAGNOSIS — M1990 Unspecified osteoarthritis, unspecified site: Secondary | ICD-10-CM | POA: Insufficient documentation

## 2019-08-03 DIAGNOSIS — C774 Secondary and unspecified malignant neoplasm of inguinal and lower limb lymph nodes: Secondary | ICD-10-CM | POA: Diagnosis not present

## 2019-08-03 DIAGNOSIS — Z8042 Family history of malignant neoplasm of prostate: Secondary | ICD-10-CM | POA: Insufficient documentation

## 2019-08-03 DIAGNOSIS — K219 Gastro-esophageal reflux disease without esophagitis: Secondary | ICD-10-CM | POA: Insufficient documentation

## 2019-08-03 DIAGNOSIS — Z6830 Body mass index (BMI) 30.0-30.9, adult: Secondary | ICD-10-CM | POA: Insufficient documentation

## 2019-08-03 DIAGNOSIS — G4730 Sleep apnea, unspecified: Secondary | ICD-10-CM | POA: Insufficient documentation

## 2019-08-03 SURGERY — PROSTATE VOLUME STUDIES
Anesthesia: Choice

## 2019-08-03 NOTE — Telephone Encounter (Signed)
error 

## 2019-08-03 NOTE — H&P (View-Only) (Signed)
NEW PATIENT EVALUATION  Name: Shawn Melton  MRN: [id number]  Date:   08/03/2019     DOB: 1956-08-27   This 63 y.o. 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  Seen today for history and physical and volume study in anticipation of I-125 interstitial implant for boost  REFERRING PHYSICIAN: Baxter Hire, MD  CHIEF COMPLAINT:  Chief Complaint  Patient presents with  . Prostate Cancer    Post volume study    DIAGNOSIS: The encounter diagnosis was Malignant neoplasm of prostate ([place]).   PREVIOUS INVESTIGATIONS:  Pathology report reviewed Clinical notes reviewed  HPI: Patient is a 63 year old male who presented with a PSA of 7.1.  He was found to have Gleason 9 (4+5) adenocarcinoma the prostate in 7 of 12 cores biopsied.  Bone scan showed no evidence of disease he CT scan showed mild enlarged prostate with asymmetric larger right seminal vesicle.  CT was suspicious for tumor invasion of his seminal vesicle.  No pelvic adenopathy was seen.  He was started on androgen deprivation therapy and has recently completed external beam radiation therapy to both his prostate and pelvic nodes.  He is seen today for a volume study in anticipation of I-125 interstitial implant for boost.  He is doing fairly well having very little increased lower urinary tract symptoms diarrhea or fatigue.  PLANNED TREATMENT REGIMEN: I-125 interstitial implant for boost  PAST MEDICAL HISTORY:  has a past medical history of Chronic ankle pain, DJD (degenerative joint disease), GERD (gastroesophageal reflux disease), H/O endoscopy, History of stomach ulcers, colonoscopy, Hypertension, Obesity, Reflux, Skin cancer, and Sleep apnea.    PAST SURGICAL HISTORY:  Past Surgical History:  Procedure Laterality Date  . ABSCESS DRAINAGE    . ROBOTIC PELVIC AND PARA-AORTIC LYMPH NODE DISSECTION Left 05/31/2019   Procedure: XI ROBOTIC PELVIC lymph NODE DISSECTION;  Surgeon: Hollice Espy, MD;  Location: [facility] ORS;  Service: Urology;  Laterality: Left;    FAMILY HISTORY: family history includes Colon cancer in his paternal uncle; Diabetes in his father; Hypertension in his mother; Prostate cancer in his cousin; Stroke in his father.  SOCIAL HISTORY:  reports that he has never smoked. He has never used smokeless tobacco. He reports that he does not drink alcohol or use drugs.  ALLERGIES: Neosporin [bacitracin-polymyxin b]  MEDICATIONS:  Current Outpatient Medications  Medication Sig Dispense Refill  . acyclovir (ZOVIRAX) 400 MG tablet Take 400 mg by mouth daily as needed (cold sores).     . ALPRAZolam (XANAX) 0.5 MG tablet Take 0.25 mg by mouth at bedtime as needed for sleep.   2  . ibuprofen (ADVIL) 200 MG tablet Take 400 mg by mouth every 8 (eight) hours as needed (pain.).    Marland Kitchen losartan (COZAAR) 50 MG tablet Take 50 mg by mouth at bedtime.     . Multiple Vitamin (MULTIVITAMIN WITH MINERALS) TABS tablet Take 1 tablet by mouth daily.    Marland Kitchen omeprazole (PRILOSEC) 40 MG capsule Take 40 mg by mouth daily before breakfast.     . docusate sodium (COLACE) 100 MG capsule Take 1 capsule (100 mg total) by mouth 2 (two) times daily. (Patient not taking: Reported on 07/28/2019) 60 capsule 0  . oxyCODONE-acetaminophen (PERCOCET) 5-325 MG tablet Take 1-2 tablets by mouth every 4 (four) hours as needed for moderate pain or severe pain. (Patient not taking: Reported on 07/28/2019) 10 tablet  0   No current facility-administered medications for this encounter.     ECOG PERFORMANCE STATUS:  0 - Asymptomatic  REVIEW OF SYSTEMS: Patient denies any weight loss, fatigue, weakness, fever, chills or night sweats. Patient denies any loss of vision, blurred vision. Patient denies any ringing  of the ears or hearing loss. No irregular heartbeat. Patient denies heart murmur or history of fainting. Patient denies any chest pain or  pain radiating to her upper extremities. Patient denies any shortness of breath, difficulty breathing at night, cough or hemoptysis. Patient denies any swelling in the lower legs. Patient denies any nausea vomiting, vomiting of blood, or coffee ground material in the vomitus. Patient denies any stomach pain. Patient states has had normal bowel movements no significant constipation or diarrhea. Patient denies any dysuria, hematuria or significant nocturia. Patient denies any problems walking, swelling in the joints or loss of balance. Patient denies any skin changes, loss of hair or loss of weight. Patient denies any excessive worrying or anxiety or significant depression. Patient denies any problems with insomnia. Patient denies excessive thirst, polyuria, polydipsia. Patient denies any swollen glands, patient denies easy bruising or easy bleeding. Patient denies any recent infections, allergies or URI. Patient "s visual fields have not changed significantly in recent time.   PHYSICAL EXAM: BP (!) 163/107 (BP Location: Left Arm, Patient Position: Sitting)   Pulse 73   Temp (!) 97.1 F (36.2 C) (Tympanic)   Wt 193 lb (87.5 kg)   BMI 31.15 kg/m  Well-developed well-nourished patient in NAD. HEENT reveals PERLA, EOMI, discs not visualized.  Oral cavity is clear. No oral mucosal lesions are identified. Neck is clear without evidence of cervical or supraclavicular adenopathy. Lungs are clear to A&P. Cardiac examination is essentially unremarkable with regular rate and rhythm without murmur rub or thrill. Abdomen is benign with no organomegaly or masses noted. Motor sensory and DTR levels are equal and symmetric in the upper and lower extremities. Cranial nerves II through XII are grossly intact. Proprioception is intact. No peripheral adenopathy or edema is identified. No motor or sensory levels are noted. Crude visual fields are within normal range.  LABORATORY DATA: Pathology report reviewed    RADIOLOGY  RESULTS: Ultrasound guidance used for volume study today in anticipation of I-125 interstitial implant   IMPRESSION: Stage III Gleason 9 adenocarcinoma the prostate in 63 year old male for volume study today in anticipation of I-125 interstitial implant for boost.  PLAN: Patient underwent successful volume study today.  We will use out for our brachytherapy treatment planning.  Risks and benefits of interstitial implant including radiation safety precautions possible increased lower urinary tract symptoms diarrhea and fatigue all were discussed with the patient.  Risks and benefits of general anesthesia also reviewed.  Patient comprehends her treatment plan well.  From our standpoint he is cleared to proceed with implant.  I would like to take this opportunity to thank you for allowing me to participate in the care of your patient.Noreene Filbert, MD

## 2019-08-03 NOTE — H&P (Signed)
NEW PATIENT EVALUATION  Name: Shawn Melton  MRN: [id number]  Date:   08/03/2019     DOB: 1956-02-21   This 63 y.o. 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  Seen today for history and physical and volume study in anticipation of I-125 interstitial implant for boost  REFERRING PHYSICIAN: Baxter Hire, MD  CHIEF COMPLAINT:  Chief Complaint  Patient presents with  . Prostate Cancer    Post volume study    DIAGNOSIS: The encounter diagnosis was Malignant neoplasm of prostate ([place]).   PREVIOUS INVESTIGATIONS:  Pathology report reviewed Clinical notes reviewed  HPI: Patient is a 63 year old male who presented with a PSA of 7.1.  He was found to have Gleason 9 (4+5) adenocarcinoma the prostate in 7 of 12 cores biopsied.  Bone scan showed no evidence of disease he CT scan showed mild enlarged prostate with asymmetric larger right seminal vesicle.  CT was suspicious for tumor invasion of his seminal vesicle.  No pelvic adenopathy was seen.  He was started on androgen deprivation therapy and has recently completed external beam radiation therapy to both his prostate and pelvic nodes.  He is seen today for a volume study in anticipation of I-125 interstitial implant for boost.  He is doing fairly well having very little increased lower urinary tract symptoms diarrhea or fatigue.  PLANNED TREATMENT REGIMEN: I-125 interstitial implant for boost  PAST MEDICAL HISTORY:  has a past medical history of Chronic ankle pain, DJD (degenerative joint disease), GERD (gastroesophageal reflux disease), H/O endoscopy, History of stomach ulcers, colonoscopy, Hypertension, Obesity, Reflux, Skin cancer, and Sleep apnea.    PAST SURGICAL HISTORY:  Past Surgical History:  Procedure Laterality Date  . ABSCESS DRAINAGE    . ROBOTIC PELVIC AND PARA-AORTIC LYMPH NODE DISSECTION Left 05/31/2019   Procedure: XI ROBOTIC PELVIC lymph NODE DISSECTION;  Surgeon: Hollice Espy, MD;  Location: [facility] ORS;  Service: Urology;  Laterality: Left;    FAMILY HISTORY: family history includes Colon cancer in his paternal uncle; Diabetes in his father; Hypertension in his mother; Prostate cancer in his cousin; Stroke in his father.  SOCIAL HISTORY:  reports that he has never smoked. He has never used smokeless tobacco. He reports that he does not drink alcohol or use drugs.  ALLERGIES: Neosporin [bacitracin-polymyxin b]  MEDICATIONS:  Current Outpatient Medications  Medication Sig Dispense Refill  . acyclovir (ZOVIRAX) 400 MG tablet Take 400 mg by mouth daily as needed (cold sores).     . ALPRAZolam (XANAX) 0.5 MG tablet Take 0.25 mg by mouth at bedtime as needed for sleep.   2  . ibuprofen (ADVIL) 200 MG tablet Take 400 mg by mouth every 8 (eight) hours as needed (pain.).    Marland Kitchen losartan (COZAAR) 50 MG tablet Take 50 mg by mouth at bedtime.     . Multiple Vitamin (MULTIVITAMIN WITH MINERALS) TABS tablet Take 1 tablet by mouth daily.    Marland Kitchen omeprazole (PRILOSEC) 40 MG capsule Take 40 mg by mouth daily before breakfast.     . docusate sodium (COLACE) 100 MG capsule Take 1 capsule (100 mg total) by mouth 2 (two) times daily. (Patient not taking: Reported on 07/28/2019) 60 capsule 0  . oxyCODONE-acetaminophen (PERCOCET) 5-325 MG tablet Take 1-2 tablets by mouth every 4 (four) hours as needed for moderate pain or severe pain. (Patient not taking: Reported on 07/28/2019) 10 tablet  0   No current facility-administered medications for this encounter.     ECOG PERFORMANCE STATUS:  0 - Asymptomatic  REVIEW OF SYSTEMS: Patient denies any weight loss, fatigue, weakness, fever, chills or night sweats. Patient denies any loss of vision, blurred vision. Patient denies any ringing  of the ears or hearing loss. No irregular heartbeat. Patient denies heart murmur or history of fainting. Patient denies any chest pain or  pain radiating to her upper extremities. Patient denies any shortness of breath, difficulty breathing at night, cough or hemoptysis. Patient denies any swelling in the lower legs. Patient denies any nausea vomiting, vomiting of blood, or coffee ground material in the vomitus. Patient denies any stomach pain. Patient states has had normal bowel movements no significant constipation or diarrhea. Patient denies any dysuria, hematuria or significant nocturia. Patient denies any problems walking, swelling in the joints or loss of balance. Patient denies any skin changes, loss of hair or loss of weight. Patient denies any excessive worrying or anxiety or significant depression. Patient denies any problems with insomnia. Patient denies excessive thirst, polyuria, polydipsia. Patient denies any swollen glands, patient denies easy bruising or easy bleeding. Patient denies any recent infections, allergies or URI. Patient "s visual fields have not changed significantly in recent time.   PHYSICAL EXAM: BP (!) 163/107 (BP Location: Left Arm, Patient Position: Sitting)   Pulse 73   Temp (!) 97.1 F (36.2 C) (Tympanic)   Wt 193 lb (87.5 kg)   BMI 31.15 kg/m  Well-developed well-nourished patient in NAD. HEENT reveals PERLA, EOMI, discs not visualized.  Oral cavity is clear. No oral mucosal lesions are identified. Neck is clear without evidence of cervical or supraclavicular adenopathy. Lungs are clear to A&P. Cardiac examination is essentially unremarkable with regular rate and rhythm without murmur rub or thrill. Abdomen is benign with no organomegaly or masses noted. Motor sensory and DTR levels are equal and symmetric in the upper and lower extremities. Cranial nerves II through XII are grossly intact. Proprioception is intact. No peripheral adenopathy or edema is identified. No motor or sensory levels are noted. Crude visual fields are within normal range.  LABORATORY DATA: Pathology report reviewed    RADIOLOGY  RESULTS: Ultrasound guidance used for volume study today in anticipation of I-125 interstitial implant   IMPRESSION: Stage III Gleason 9 adenocarcinoma the prostate in 63 year old male for volume study today in anticipation of I-125 interstitial implant for boost.  PLAN: Patient underwent successful volume study today.  We will use out for our brachytherapy treatment planning.  Risks and benefits of interstitial implant including radiation safety precautions possible increased lower urinary tract symptoms diarrhea and fatigue all were discussed with the patient.  Risks and benefits of general anesthesia also reviewed.  Patient comprehends her treatment plan well.  From our standpoint he is cleared to proceed with implant.  I would like to take this opportunity to thank you for allowing me to participate in the care of your patient.Noreene Filbert, MD

## 2019-08-03 NOTE — Progress Notes (Signed)
Radiation Oncology Follow up Note  Name: Shawn Melton   Date:   08/03/2019 MRN:  [id number] DOB: [birth year]    This 63 y.o. male presents to the hospital today for volume study in anticipation of I-125 interstitial implant for boost  REFERRING PROVIDER: Hollice Espy, MD  HPI: Patient is a 63 year old male who is completing radiation therapy with external beam to both his prostate and pelvic nodes for stage III Gleason 9 (4+5) adenocarcinoma the prostate presenting with a PSA in the 7 range.  He is seen today and doing well.  He is without complaint..  COMPLICATIONS OF TREATMENT: none  FOLLOW UP COMPLIANCE: keeps appointments   PHYSICAL EXAM:  There were no vitals taken for this visit. Well-developed well-nourished patient in NAD. HEENT reveals PERLA, EOMI, discs not visualized.  Oral cavity is clear. No oral mucosal lesions are identified. Neck is clear without evidence of cervical or supraclavicular adenopathy. Lungs are clear to A&P. Cardiac examination is essentially unremarkable with regular rate and rhythm without murmur rub or thrill. Abdomen is benign with no organomegaly or masses noted. Motor sensory and DTR levels are equal and symmetric in the upper and lower extremities. Cranial nerves II through XII are grossly intact. Proprioception is intact. No peripheral adenopathy or edema is identified. No motor or sensory levels are noted. Crude visual fields are within normal range.  RADIOLOGY RESULTS: Ultrasound used for volume study today  PLAN: Patient was taken to the cystoscopy suite in the OR. Patient was placed in the low lithotomy position. Foley catheter was placed. Trans-rectal ultrasound probe was inserted into the rectum and prostate seminal vesicles were visualized as well as bladder base. stepping images were performed on a 5 mm increments. Images will be placed in BrachyVision treatment planning system to determine seed placement coordinates for eventual I-125 interstitial  implant. Images will be reviewed with the physics and dosimetry staff for final quality approval. I personally was present for the volume study and assisted in delineation of contour volumes.  At the end of the procedure Foley catheter was removed, rectal ultrasound probe was removed. Patient tolerated his procedures extremely well with no side effects or complaints. Patient has given appointment for interstitial implant date. Consent was signed today as well as history and physical performed in preparation for his outpatient surgical implant.     Noreene Filbert, MD

## 2019-08-10 DIAGNOSIS — C61 Malignant neoplasm of prostate: Secondary | ICD-10-CM | POA: Diagnosis present

## 2019-08-10 DIAGNOSIS — Z8042 Family history of malignant neoplasm of prostate: Secondary | ICD-10-CM | POA: Diagnosis not present

## 2019-08-10 DIAGNOSIS — I10 Essential (primary) hypertension: Secondary | ICD-10-CM | POA: Diagnosis not present

## 2019-08-10 DIAGNOSIS — K219 Gastro-esophageal reflux disease without esophagitis: Secondary | ICD-10-CM | POA: Diagnosis not present

## 2019-08-10 DIAGNOSIS — Z79899 Other long term (current) drug therapy: Secondary | ICD-10-CM | POA: Diagnosis not present

## 2019-08-10 DIAGNOSIS — E669 Obesity, unspecified: Secondary | ICD-10-CM | POA: Diagnosis not present

## 2019-08-10 DIAGNOSIS — M1990 Unspecified osteoarthritis, unspecified site: Secondary | ICD-10-CM | POA: Diagnosis not present

## 2019-08-10 DIAGNOSIS — G4730 Sleep apnea, unspecified: Secondary | ICD-10-CM | POA: Diagnosis not present

## 2019-08-10 DIAGNOSIS — Z6830 Body mass index (BMI) 30.0-30.9, adult: Secondary | ICD-10-CM | POA: Diagnosis not present

## 2019-08-23 ENCOUNTER — Encounter
Admission: RE | Admit: 2019-08-23 | Discharge: 2019-08-23 | Disposition: A | Discharge status: Home / Self Care | Payer: BC Managed Care – PPO | Source: Ambulatory Visit | Attending: Urology | Admitting: Urology

## 2019-08-23 ENCOUNTER — Other Ambulatory Visit: Payer: Self-pay

## 2019-08-23 DIAGNOSIS — Z01812 Encounter for preprocedural laboratory examination: Secondary | ICD-10-CM | POA: Diagnosis present

## 2019-08-23 LAB — CBC
HCT: 38 % — ABNORMAL LOW (ref 39.0–52.0)
Hemoglobin: 12.9 g/dL — ABNORMAL LOW (ref 13.0–17.0)
MCH: 30.4 pg (ref 26.0–34.0)
MCHC: 33.9 g/dL (ref 30.0–36.0)
MCV: 89.6 fL (ref 80.0–100.0)
Platelets: 257 10*3/uL (ref 150–400)
RBC: 4.24 MIL/uL (ref 4.22–5.81)
RDW: 13 % (ref 11.5–15.5)
WBC: 6.7 10*3/uL (ref 4.0–10.5)
nRBC: 0 % (ref 0.0–0.2)

## 2019-08-23 NOTE — Patient Instructions (Signed)
Your COVID19 preprocedure swab is scheduled on: Thursday August 26, 2019. Drive up [place] front of [place] any time 8-10:30 am.  Your procedure is scheduled on: Monday August 30, 2019 Report to Same Day Surgery 2nd floor [facility] Entrance-take elevator on left to 2nd floor.  Check in with surgery information desk.) To find out your arrival time, call [phone] 1:00-3:00 PM on Friday August 27, 2019  Remember: Instructions that are not followed completely may result in serious medical risk, up to and including death, or upon the discretion of your surgeon and anesthesiologist your surgery may need to be rescheduled.    __x__ 1. Do not eat food (including mints, candies, chewing gum) after midnight the night before your procedure. You may drink clear liquids up to 2 hours before you are scheduled to arrive at the hospital for your procedure.  Do not drink anything within 2 hours of your scheduled arrival to the hospital.  Approved clear liquids:  --Water or Apple juice without pulp  --Clear carbohydrate beverage such as Gatorade or Powerade  --Black Coffee or Clear Tea (No milk, no creamers, do not add anything to the coffee or tea)    __x__ 2. No Alcohol for 24 hours before or after surgery.   __x__ 3. No Smoking or e-cigarettes for 24 hours before surgery.  Do not use any chewable tobacco products for at least 6 hours before surgery.   __x__ 4. Notify your doctor if there is any change in your medical condition (cold, fever, infections).   __x__ 5. On the morning of surgery brush your teeth with toothpaste and water.  You may rinse your mouth with mouthwash if you wish.  Do not swallow any toothpaste or mouthwash.  Please read over the following fact sheets that you were given:   [facility] Preparing for Surgery and/or MRSA Information    __x__ Use CHG Soap or Sage wipes as directed on instruction sheet or Use antibacterial soap such as Dial to  shower/bathe on the day of surgery.   Do not wear jewelry on the day of surgery.  Do not wear lotions, powders, deodorant, or perfumes.   Do not shave below the face/neck 48 hours prior to surgery.   Do not bring valuables to the hospital.    [facility] is not responsible for any belongings or valuables.    For patients discharged on the day of surgery, you will NOT be permitted to drive yourself home.  You must have a responsible adult with you for 24 hours after surgery.  __x__ Take these medicines on the morning of surgery with a SMALL SIP OF WATER:  1. Prilosec/Omeprazole  __x__ Use Fleets enema the morning of surgery.  ____ Follow recommendations from Cardiologist, Pulmonologist or PCP regarding stopping blood thinners such as Aspirin, Coumadin, Plavix, Eliquis, Effient, Pradaxa, and Pletal.  __x__ STARTING TODAY: Stop Anti-inflammatories such as Advil, Ibuprofen, Motrin, Aleve, Naproxen, Naprosyn, BC/Goodies powders or aspirin products. You may continue to take Tylenol and Celebrex.   __x__ STARTING TODAY: Do not take any over the counter supplements until after surgery. You may continue to take Vitamin D, Vitamin B, and multivitamin.

## 2019-08-26 ENCOUNTER — Other Ambulatory Visit: Payer: Self-pay

## 2019-08-26 ENCOUNTER — Encounter
Admission: RE | Admit: 2019-08-26 | Discharge: 2019-08-26 | Disposition: A | Discharge status: Home / Self Care | Payer: BC Managed Care – PPO | Source: Ambulatory Visit | Attending: Urology | Admitting: Urology

## 2019-08-26 ENCOUNTER — Encounter: Payer: Self-pay | Admitting: Radiation Oncology

## 2019-08-26 ENCOUNTER — Encounter: Payer: Self-pay | Admitting: Urology

## 2019-08-26 DIAGNOSIS — Z20828 Contact with and (suspected) exposure to other viral communicable diseases: Secondary | ICD-10-CM | POA: Insufficient documentation

## 2019-08-26 DIAGNOSIS — Z01812 Encounter for preprocedural laboratory examination: Secondary | ICD-10-CM | POA: Diagnosis present

## 2019-08-27 DIAGNOSIS — C61 Malignant neoplasm of prostate: Secondary | ICD-10-CM

## 2019-08-27 LAB — SARS CORONAVIRUS 2 (TAT 6-24 HRS): SARS Coronavirus 2: NEGATIVE

## 2019-08-29 MED ORDER — CIPROFLOXACIN IN D5W 400 MG/200ML IV SOLN
400.0000 mg | INTRAVENOUS | Status: AC
  Administered 2019-08-30: 400 mg via INTRAVENOUS

## 2019-08-30 ENCOUNTER — Ambulatory Visit
Admission: RE | Admit: 2019-08-30 | Discharge: 2019-08-30 | Disposition: A | Discharge status: Home / Self Care | Payer: BC Managed Care – PPO | Source: Ambulatory Visit | Attending: Urology | Admitting: Urology

## 2019-08-30 ENCOUNTER — Other Ambulatory Visit: Payer: Self-pay | Admitting: Urology

## 2019-08-30 ENCOUNTER — Ambulatory Visit: Payer: BC Managed Care – PPO | Admitting: Anesthesiology

## 2019-08-30 ENCOUNTER — Encounter
Admission: RE | Disposition: A | Discharge status: Home / Self Care | Payer: Self-pay | Source: Ambulatory Visit | Attending: Urology

## 2019-08-30 ENCOUNTER — Ambulatory Visit
Admission: RE | Admit: 2019-08-30 | Discharge: 2019-08-30 | Disposition: A | Discharge status: Home / Self Care | Payer: BC Managed Care – PPO | Source: Ambulatory Visit | Attending: Radiation Oncology | Admitting: Radiation Oncology

## 2019-08-30 ENCOUNTER — Other Ambulatory Visit: Payer: Self-pay

## 2019-08-30 DIAGNOSIS — C61 Malignant neoplasm of prostate: Secondary | ICD-10-CM | POA: Diagnosis present

## 2019-08-30 DIAGNOSIS — K219 Gastro-esophageal reflux disease without esophagitis: Secondary | ICD-10-CM | POA: Insufficient documentation

## 2019-08-30 DIAGNOSIS — E669 Obesity, unspecified: Secondary | ICD-10-CM | POA: Insufficient documentation

## 2019-08-30 DIAGNOSIS — Z6830 Body mass index (BMI) 30.0-30.9, adult: Secondary | ICD-10-CM | POA: Insufficient documentation

## 2019-08-30 DIAGNOSIS — Z8042 Family history of malignant neoplasm of prostate: Secondary | ICD-10-CM | POA: Diagnosis not present

## 2019-08-30 DIAGNOSIS — Z8249 Family history of ischemic heart disease and other diseases of the circulatory system: Secondary | ICD-10-CM | POA: Insufficient documentation

## 2019-08-30 DIAGNOSIS — G4730 Sleep apnea, unspecified: Secondary | ICD-10-CM | POA: Insufficient documentation

## 2019-08-30 DIAGNOSIS — Z85828 Personal history of other malignant neoplasm of skin: Secondary | ICD-10-CM | POA: Insufficient documentation

## 2019-08-30 DIAGNOSIS — M1990 Unspecified osteoarthritis, unspecified site: Secondary | ICD-10-CM | POA: Diagnosis not present

## 2019-08-30 DIAGNOSIS — I10 Essential (primary) hypertension: Secondary | ICD-10-CM | POA: Diagnosis not present

## 2019-08-30 DIAGNOSIS — Z79899 Other long term (current) drug therapy: Secondary | ICD-10-CM | POA: Diagnosis not present

## 2019-08-30 HISTORY — PX: RADIOACTIVE SEED IMPLANT: SHX5150

## 2019-08-30 SURGERY — INSERTION, RADIATION SOURCE, PROSTATE
Anesthesia: General

## 2019-08-30 MED ORDER — OXYCODONE HCL 5 MG PO TABS
5.0000 mg | ORAL_TABLET | Freq: Once | ORAL | Status: AC | PRN
  Administered 2019-08-30: 5 mg via ORAL

## 2019-08-30 MED ORDER — SUGAMMADEX SODIUM 200 MG/2ML IV SOLN
INTRAVENOUS | Status: DC | PRN
  Administered 2019-08-30: 200 mg via INTRAVENOUS

## 2019-08-30 MED ORDER — ROCURONIUM BROMIDE 50 MG/5ML IV SOLN
INTRAVENOUS | Status: AC
  Filled 2019-08-30: qty 1

## 2019-08-30 MED ORDER — BACITRACIN ZINC 500 UNIT/GM EX OINT
TOPICAL_OINTMENT | CUTANEOUS | Status: AC
  Filled 2019-08-30: qty 28.35

## 2019-08-30 MED ORDER — ONDANSETRON HCL 4 MG/2ML IJ SOLN
INTRAMUSCULAR | Status: DC | PRN
  Administered 2019-08-30: 4 mg via INTRAVENOUS

## 2019-08-30 MED ORDER — FENTANYL CITRATE (PF) 100 MCG/2ML IJ SOLN
INTRAMUSCULAR | Status: AC
  Filled 2019-08-30: qty 2

## 2019-08-30 MED ORDER — PHENYLEPHRINE HCL (PRESSORS) 10 MG/ML IV SOLN
INTRAVENOUS | Status: DC | PRN
  Administered 2019-08-30 (×2): 100 ug via INTRAVENOUS

## 2019-08-30 MED ORDER — OXYCODONE HCL 5 MG/5ML PO SOLN: 5.0000 mg | Freq: Once | ORAL | Status: AC | PRN

## 2019-08-30 MED ORDER — FENTANYL CITRATE (PF) 100 MCG/2ML IJ SOLN: 25.0000 ug | INTRAMUSCULAR | Status: DC | PRN

## 2019-08-30 MED ORDER — BACITRACIN 500 UNIT/GM EX OINT
TOPICAL_OINTMENT | CUTANEOUS | Status: DC | PRN
  Administered 2019-08-30: 1 via TOPICAL

## 2019-08-30 MED ORDER — GLYCOPYRROLATE 0.2 MG/ML IJ SOLN
INTRAMUSCULAR | Status: DC | PRN
  Administered 2019-08-30: 0.2 mg via INTRAVENOUS

## 2019-08-30 MED ORDER — LIDOCAINE HCL (CARDIAC) PF 100 MG/5ML IV SOSY
PREFILLED_SYRINGE | INTRAVENOUS | Status: DC | PRN
  Administered 2019-08-30: 50 mg via INTRAVENOUS

## 2019-08-30 MED ORDER — LACTATED RINGERS IV SOLN
INTRAVENOUS | Status: DC
  Administered 2019-08-30: 07:00:00 via INTRAVENOUS

## 2019-08-30 MED ORDER — ONDANSETRON HCL 4 MG/2ML IJ SOLN
INTRAMUSCULAR | Status: AC
  Filled 2019-08-30: qty 2

## 2019-08-30 MED ORDER — MIDAZOLAM HCL 2 MG/2ML IJ SOLN
INTRAMUSCULAR | Status: AC
  Filled 2019-08-30: qty 2

## 2019-08-30 MED ORDER — CIPROFLOXACIN HCL 500 MG PO TABS: 500.0000 mg | ORAL_TABLET | Freq: Two times a day (BID) | ORAL | 0 refills | Status: DC

## 2019-08-30 MED ORDER — FLEET ENEMA 7-19 GM/118ML RE ENEM
1.0000 | ENEMA | Freq: Once | RECTAL | Status: AC
  Administered 2019-08-30: 1 via RECTAL

## 2019-08-30 MED ORDER — SUCCINYLCHOLINE CHLORIDE 20 MG/ML IJ SOLN
INTRAMUSCULAR | Status: AC
  Filled 2019-08-30: qty 1

## 2019-08-30 MED ORDER — SUGAMMADEX SODIUM 200 MG/2ML IV SOLN
INTRAVENOUS | Status: AC
  Filled 2019-08-30: qty 2

## 2019-08-30 MED ORDER — SUCCINYLCHOLINE CHLORIDE 20 MG/ML IJ SOLN
INTRAMUSCULAR | Status: DC | PRN
  Administered 2019-08-30: 100 mg via INTRAVENOUS

## 2019-08-30 MED ORDER — FENTANYL CITRATE (PF) 100 MCG/2ML IJ SOLN
INTRAMUSCULAR | Status: DC | PRN
  Administered 2019-08-30 (×2): 50 ug via INTRAVENOUS

## 2019-08-30 MED ORDER — ROCURONIUM BROMIDE 100 MG/10ML IV SOLN
INTRAVENOUS | Status: DC | PRN
  Administered 2019-08-30: 5 mg via INTRAVENOUS
  Administered 2019-08-30: 25 mg via INTRAVENOUS

## 2019-08-30 MED ORDER — MIDAZOLAM HCL 2 MG/2ML IJ SOLN
INTRAMUSCULAR | Status: DC | PRN
  Administered 2019-08-30: 2 mg via INTRAVENOUS

## 2019-08-30 MED ORDER — CIPROFLOXACIN IN D5W 400 MG/200ML IV SOLN
INTRAVENOUS | Status: AC
  Filled 2019-08-30: qty 200

## 2019-08-30 MED ORDER — GLYCOPYRROLATE 0.2 MG/ML IJ SOLN
INTRAMUSCULAR | Status: AC
  Filled 2019-08-30: qty 1

## 2019-08-30 MED ORDER — PROPOFOL 10 MG/ML IV BOLUS
INTRAVENOUS | Status: AC
  Filled 2019-08-30: qty 20

## 2019-08-30 MED ORDER — TAMSULOSIN HCL 0.4 MG PO CAPS: 0.4000 mg | ORAL_CAPSULE | Freq: Every day | ORAL | 0 refills | Status: DC

## 2019-08-30 MED ORDER — DEXAMETHASONE SODIUM PHOSPHATE 10 MG/ML IJ SOLN
INTRAMUSCULAR | Status: AC
  Filled 2019-08-30: qty 1

## 2019-08-30 MED ORDER — PROPOFOL 10 MG/ML IV BOLUS
INTRAVENOUS | Status: DC | PRN
  Administered 2019-08-30: 150 mg via INTRAVENOUS

## 2019-08-30 MED ORDER — OXYCODONE HCL 5 MG PO TABS
ORAL_TABLET | ORAL | Status: AC
  Filled 2019-08-30: qty 1

## 2019-08-30 MED ORDER — LIDOCAINE HCL (PF) 2 % IJ SOLN
INTRAMUSCULAR | Status: AC
  Filled 2019-08-30: qty 10

## 2019-08-30 MED ORDER — DEXAMETHASONE SODIUM PHOSPHATE 10 MG/ML IJ SOLN
INTRAMUSCULAR | Status: DC | PRN
  Administered 2019-08-30: 10 mg via INTRAVENOUS

## 2019-08-30 MED ORDER — DOCUSATE SODIUM 100 MG PO CAPS: 100.0000 mg | ORAL_CAPSULE | Freq: Two times a day (BID) | ORAL | 0 refills | Status: DC

## 2019-08-30 SURGICAL SUPPLY — 22 items
BAG URINE DRAINAGE (UROLOGICAL SUPPLIES) ×2 IMPLANT
BLADE CLIPPER SURG (BLADE) ×2 IMPLANT
CATH FOL 2WAY LX 16X5 (CATHETERS) ×2 IMPLANT
COVER BACK TABLE REUSABLE LG (DRAPES) ×2 IMPLANT
DRAPE INCISE 23X17 IOBAN STRL (DRAPES) ×1
DRAPE INCISE IOBAN 23X17 STRL (DRAPES) ×1 IMPLANT
DRAPE UNDER BUTTOCK W/FLU (DRAPES) ×2 IMPLANT
DRSG TELFA 3X8 NADH (GAUZE/BANDAGES/DRESSINGS) ×2 IMPLANT
GLOVE BIO SURGEON STRL SZ 6.5 (GLOVE) ×4 IMPLANT
GLOVE BIO SURGEON STRL SZ7.5 (GLOVE) ×4 IMPLANT
GOWN STRL REUS W/ TWL LRG LVL3 (GOWN DISPOSABLE) ×2 IMPLANT
GOWN STRL REUS W/ TWL XL LVL3 (GOWN DISPOSABLE) ×1 IMPLANT
GOWN STRL REUS W/TWL LRG LVL3 (GOWN DISPOSABLE) ×2
GOWN STRL REUS W/TWL XL LVL3 (GOWN DISPOSABLE) ×1
IV NS 1000ML (IV SOLUTION) ×1
IV NS 1000ML BAXH (IV SOLUTION) ×1 IMPLANT
KIT TURNOVER CYSTO (KITS) ×2 IMPLANT
PACK CYSTO AR (MISCELLANEOUS) ×2 IMPLANT
SET CYSTO W/LG BORE CLAMP LF (SET/KITS/TRAYS/PACK) ×2 IMPLANT
SURGILUBE 2OZ TUBE FLIPTOP (MISCELLANEOUS) ×2 IMPLANT
SYR 10ML LL (SYRINGE) ×2 IMPLANT
WATER STERILE IRR 1000ML POUR (IV SOLUTION) ×2 IMPLANT

## 2019-08-30 NOTE — Op Note (Signed)
Preoperative diagnosis: Adenocarcinoma of the prostate   Postoperative diagnosis: Same   Procedure: I-125 prostate seed implantation, cystoscopy  Surgeon: Hollice Espy M.D.  Radiation Oncologist: Lavena Stanford, M.D.   Anesthesia: General  Drains: none  Complications: none  Indications: Prostate cancer  Procedure: Patient was brought to operating suite and placement table in the supine position. At this time, a universal timeout protocol was performed, all team members were identified, Venodyne boots are placed, and he was administered IV Ancef in the preoperative period. He was placed in lithotomy position and prepped and draped in usual manner. Radiation oncology department placed a transrectal ultrasound probe anchoring stand/ grid and aligned with previous imaging from the volume study. Foley catheter was inserted without difficulty.  All needle passage was done with real-time transrectal ultrasound guidance in both the transverse and sagittal plains in order to achieve the desired preplanned position. A total of 23 needles were placed.  86 active seeds were implanted. The Foley catheter was removed and a rigid cystoscopy failed to show any seeds outside the prostate without evidence of trauma to the urethral, prostatic fossa, or bladder.  The bladder was drained.  A fluoroscopic image was then obtained showing excellent distrubution of the brachytherapy seeds.  Each seed was counted and counts were correct.    The patient was then repositioned in the supine position, reversed from anesthesia, and taken to the PACU in stable condition.

## 2019-08-30 NOTE — Anesthesia Postprocedure Evaluation (Signed)
Anesthesia Post Note  Patient: Shawn Melton  Procedure(s) Performed: RADIOACTIVE SEED IMPLANT/BRACHYTHERAPY IMPLANT (N/A )  Patient location during evaluation: PACU Anesthesia Type: General Level of consciousness: awake and alert Pain management: pain level controlled Vital Signs Assessment: post-procedure vital signs reviewed and stable Respiratory status: spontaneous breathing, nonlabored ventilation, respiratory function stable and patient connected to nasal cannula oxygen Cardiovascular status: blood pressure returned to baseline and stable Postop Assessment: no apparent nausea or vomiting Anesthetic complications: no     Last Vitals:  Vitals:   08/30/19 0926 08/30/19 0940  BP: (!) 153/92 (!) 157/93  Pulse:  70  Resp:  18  Temp: (!) 36.2 C (!) 36 C  SpO2:  97%    Last Pain:  Vitals:   08/30/19 0940  TempSrc: Temporal  PainSc: 2                  Precious Haws [place]

## 2019-08-30 NOTE — Progress Notes (Signed)
Pt. Voided 150 ml , post void bladder scan 21 ml.

## 2019-08-30 NOTE — Transfer of Care (Signed)
Immediate Anesthesia Transfer of Care Note  Patient: Shawn Melton  Procedure(s) Performed: RADIOACTIVE SEED IMPLANT/BRACHYTHERAPY IMPLANT (N/A )  Patient Location: PACU  Anesthesia Type:General  Level of Consciousness: sedated  Airway & Oxygen Therapy: Patient Spontanous Breathing and Patient connected to face mask oxygen  Post-op Assessment: Report given to RN and Post -op Vital signs reviewed and stable  Post vital signs: Reviewed  Last Vitals:  Vitals Value Taken Time  BP 98/74 08/30/19 0845  Temp 36 C 08/30/19 0845  Pulse 61 08/30/19 0845  Resp 10 08/30/19 0845  SpO2 94 % 08/30/19 0845  Vitals shown include unvalidated device data.  Last Pain:  Vitals:   08/30/19 0615  TempSrc: Tympanic      Patients Stated Pain Goal: 0 ([id number])  Complications: No apparent anesthesia complications

## 2019-08-30 NOTE — H&P (Signed)
Name: Shawn Melton            MRN: [id number]        Date:   08/03/2019     DOB: 09-07-1956   This 63 y.o. 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  Seen today for history and physical and volume study in anticipation of I-125 interstitial implant for boost  REFERRING PHYSICIAN: Baxter Hire, MD  CHIEF COMPLAINT:      Chief Complaint  Patient presents with  . Prostate Cancer    Post volume study    DIAGNOSIS: The encounter diagnosis was Malignant neoplasm of prostate ([place]).   PREVIOUS INVESTIGATIONS:  Pathology report reviewed Clinical notes reviewed  HPI: Patient is a 63 year old male who presented with a PSA of 7.1.  He was found to have Gleason 9 (4+5) adenocarcinoma the prostate in 7 of 12 cores biopsied.  Bone scan showed no evidence of disease he CT scan showed mild enlarged prostate with asymmetric larger right seminal vesicle.  CT was suspicious for tumor invasion of his seminal vesicle.  No pelvic adenopathy was seen.  He was started on androgen deprivation therapy and has recently completed external beam radiation therapy to both his prostate and pelvic nodes.  He is seen today for a volume study in anticipation of I-125 interstitial implant for boost.  He is doing fairly well having very little increased lower urinary tract symptoms diarrhea or fatigue.  PLANNED TREATMENT REGIMEN: I-125 interstitial implant for boost  PAST MEDICAL HISTORY:  has a past medical history of Chronic ankle pain, DJD (degenerative joint disease), GERD (gastroesophageal reflux disease), H/O endoscopy, History of stomach ulcers, colonoscopy, Hypertension, Obesity, Reflux, Skin cancer, and Sleep apnea.    PAST SURGICAL HISTORY:       Past Surgical History:  Procedure Laterality Date  . ABSCESS DRAINAGE    . ROBOTIC PELVIC AND PARA-AORTIC LYMPH NODE DISSECTION Left  05/31/2019   Procedure: XI ROBOTIC PELVIC lymph NODE DISSECTION;  Surgeon: Hollice Espy, MD;  Location: [facility] ORS;  Service: Urology;  Laterality: Left;    FAMILY HISTORY: family history includes Colon cancer in his paternal uncle; Diabetes in his father; Hypertension in his mother; Prostate cancer in his cousin; Stroke in his father.  SOCIAL HISTORY:  reports that he has never smoked. He has never used smokeless tobacco. He reports that he does not drink alcohol or use drugs.  ALLERGIES: Neosporin [bacitracin-polymyxin b]  MEDICATIONS:        Current Outpatient Medications  Medication Sig Dispense Refill  . acyclovir (ZOVIRAX) 400 MG tablet Take 400 mg by mouth daily as needed (cold sores).     . ALPRAZolam (XANAX) 0.5 MG tablet Take 0.25 mg by mouth at bedtime as needed for sleep.   2  . ibuprofen (ADVIL) 200 MG tablet Take 400 mg by mouth every 8 (eight) hours as needed (pain.).    Marland Kitchen losartan (COZAAR) 50 MG tablet Take 50 mg by mouth at bedtime.     . Multiple Vitamin (MULTIVITAMIN WITH MINERALS) TABS tablet Take 1 tablet by mouth daily.    Marland Kitchen omeprazole (PRILOSEC) 40 MG capsule Take 40 mg by mouth daily before breakfast.     . docusate sodium (COLACE) 100 MG capsule Take 1 capsule (100 mg total) by mouth 2 (two) times daily. (Patient not taking: Reported on 07/28/2019) 60 capsule 0  . oxyCODONE-acetaminophen (PERCOCET)  5-325 MG tablet Take 1-2 tablets by mouth every 4 (four) hours as needed for moderate pain or severe pain. (Patient not taking: Reported on 07/28/2019) 10 tablet 0   No current facility-administered medications for this encounter.     ECOG PERFORMANCE STATUS:  0 - Asymptomatic  REVIEW OF SYSTEMS: Patient denies any weight loss, fatigue, weakness, fever, chills or night sweats. Patient denies any loss of vision, blurred vision. Patient denies any ringing  of the ears or hearing loss. No irregular heartbeat. Patient denies heart murmur or history of  fainting. Patient denies any chest pain or pain radiating to her upper extremities. Patient denies any shortness of breath, difficulty breathing at night, cough or hemoptysis. Patient denies any swelling in the lower legs. Patient denies any nausea vomiting, vomiting of blood, or coffee ground material in the vomitus. Patient denies any stomach pain. Patient states has had normal bowel movements no significant constipation or diarrhea. Patient denies any dysuria, hematuria or significant nocturia. Patient denies any problems walking, swelling in the joints or loss of balance. Patient denies any skin changes, loss of hair or loss of weight. Patient denies any excessive worrying or anxiety or significant depression. Patient denies any problems with insomnia. Patient denies excessive thirst, polyuria, polydipsia. Patient denies any swollen glands, patient denies easy bruising or easy bleeding. Patient denies any recent infections, allergies or URI. Patient "s visual fields have not changed significantly in recent time.   PHYSICAL EXAM: BP (!) 163/107 (BP Location: Left Arm, Patient Position: Sitting)   Pulse 73   Temp (!) 97.1 F (36.2 C) (Tympanic)   Wt 193 lb (87.5 kg)   BMI 31.15 kg/m  Well-developed well-nourished patient in NAD. HEENT reveals PERLA, EOMI, discs not visualized.  Oral cavity is clear. No oral mucosal lesions are identified. Neck is clear without evidence of cervical or supraclavicular adenopathy. Lungs are clear to A&P. Cardiac examination is essentially unremarkable with regular rate and rhythm without murmur rub or thrill. Abdomen is benign with no organomegaly or masses noted. Motor sensory and DTR levels are equal and symmetric in the upper and lower extremities. Cranial nerves II through XII are grossly intact. Proprioception is intact. No peripheral adenopathy or edema is identified. No motor or sensory levels are noted. Crude visual fields are within normal range.  LABORATORY  DATA: Pathology report reviewed    RADIOLOGY RESULTS: Ultrasound guidance used for volume study today in anticipation of I-125 interstitial implant   IMPRESSION: Stage III Gleason 9 adenocarcinoma the prostate in 63 year old male for volume study today in anticipation of I-125 interstitial implant for boost.  PLAN: Patient underwent successful volume study today.  We will use out for our brachytherapy treatment planning.  Risks and benefits of interstitial implant including radiation safety precautions possible increased lower urinary tract symptoms diarrhea and fatigue all were discussed with the patient.  Risks and benefits of general anesthesia also reviewed.  Patient comprehends her treatment plan well.  From our standpoint he is cleared to proceed with implant.  I would like to take this opportunity to thank you for allowing me to participate in the care of your patient.Noreene Filbert, MD                Electronically signed by Noreene Filbert, MD at 08/03/2019 11:56 AM   Admission (Discharged) on 08/30/2019     Detailed Report

## 2019-08-30 NOTE — Anesthesia Preprocedure Evaluation (Signed)
Anesthesia Evaluation  Patient identified by MRN, date of birth, ID band Patient awake    Reviewed: Allergy & Precautions, H&P , NPO status , Patient's Chart, lab work & pertinent test results  History of Anesthesia Complications Negative for: history of anesthetic complications  Airway Mallampati: III  TM Distance: <3 FB Neck ROM: full    Dental  (+) Chipped   Pulmonary neg shortness of breath, sleep apnea ,           Cardiovascular Exercise Tolerance: Good hypertension, (-) angina(-) Past MI and (-) DOE      Neuro/Psych negative neurological ROS  negative psych ROS   GI/Hepatic Neg liver ROS, GERD  Medicated and Controlled,  Endo/Other  negative endocrine ROS  Renal/GU      Musculoskeletal  (+) Arthritis ,   Abdominal   Peds  Hematology negative hematology ROS (+)   Anesthesia Other Findings Past Medical History: No date: Chronic ankle pain No date: DJD (degenerative joint disease) No date: GERD (gastroesophageal reflux disease) No date: H/O endoscopy No date: History of stomach ulcers No date: Hx of colonoscopy No date: Hypertension No date: Obesity No date: Reflux No date: Skin cancer No date: Sleep apnea     Comment:  using upper/lower mouth piece since 2016 ( not using               CPAP)   Past Surgical History: No date: ABSCESS DRAINAGE 05/31/2019: ROBOTIC PELVIC AND PARA-AORTIC LYMPH NODE DISSECTION; Left     Comment:  Procedure: XI ROBOTIC PELVIC lymph NODE DISSECTION;                Surgeon: Hollice Espy, MD;  Location: [facility] ORS;                Service: Urology;  Laterality: Left;  BMI    Body Mass Index: 30.67 kg/m      Reproductive/Obstetrics negative OB ROS                             Anesthesia Physical Anesthesia Plan  ASA: III  Anesthesia Plan: General ETT   Post-op Pain Management:    Induction: Intravenous  PONV Risk Score and Plan:  Ondansetron, Dexamethasone, Midazolam and Treatment may vary due to age or medical condition  Airway Management Planned: Oral ETT and Video Laryngoscope Planned  Additional Equipment:   Intra-op Plan:   Post-operative Plan: Extubation in OR  Informed Consent: I have reviewed the patients History and Physical, chart, labs and discussed the procedure including the risks, benefits and alternatives for the proposed anesthesia with the patient or authorized representative who has indicated his/her understanding and acceptance.     Dental Advisory Given  Plan Discussed with: Anesthesiologist, CRNA and Surgeon  Anesthesia Plan Comments: (Patient consented for risks of anesthesia including but not limited to:  - adverse reactions to medications - damage to teeth, lips or other oral mucosa - sore throat or hoarseness - Damage to heart, brain, lungs or loss of life  Patient voiced understanding.)        Anesthesia Quick Evaluation

## 2019-08-30 NOTE — Discharge Instructions (Signed)
AMBULATORY SURGERY  DISCHARGE INSTRUCTIONS   1) The drugs that you were given will stay in your system until tomorrow so for the next 24 hours you should not:  A) Drive an automobile B) Make any legal decisions C) Drink any alcoholic beverage   2) You may resume regular meals tomorrow.  Today it is better to start with liquids and gradually work up to solid foods.  You may eat anything you prefer, but it is better to start with liquids, then soup and crackers, and gradually work up to solid foods.   3) Please notify your doctor immediately if you have any unusual bleeding, trouble breathing, redness and pain at the surgery site, drainage, fever, or pain not relieved by medication.    4) Additional Instructions:        Please contact your physician with any problems or Same Day Surgery at [phone], Monday through Friday 6 am to 4 pm, or [place] at [facility] number at [phone].  Brachytherapy for Prostate Cancer, Care After  This sheet gives you information about how to care for yourself after your procedure. Your health care provider may also give you more specific instructions. If you have problems or questions, contact your health care provider. What can I expect after the procedure? After the procedure, it is common to have:  Trouble passing urine.  Blood in the urine or semen.  Constipation.  Frequent feeling of an urgent need to urinate.  Bruising, swelling, and tenderness of the area behind the scrotum (perineum).  Bloating and gas.  Fatigue.  Burning or pain in the rectum.  Problems getting or keeping an erection (erectile dysfunction).  Nausea. Follow these instructions at home: Managing pain, stiffness, and swelling  If directed, apply ice to the affected area: ? Put ice in a plastic bag. ? Place a towel between your skin and the bag. ? Leave the ice on for 20 minutes, 2-3 times a day.  Try not to sit directly on the area behind  the scrotum. A soft cushion can help with discomfort. Activity  Do not drive for 24 hours if you were given a medicine to help you relax (sedative).  Do not drive or use heavy machinery while taking prescription pain medicine.  Rest as told by your health care provider.  Most people can return to normal activities a few days or weeks after the procedure. Ask your health care provider what activities are safe for you. Eating and drinking  Drink enough fluid to keep your urine clear or pale yellow.  Eat a healthy, balanced diet. This includes lean proteins, whole grains, and plenty of fruits and vegetables. General instructions  Take over-the-counter and prescription medicines only as told by your health care provider.  Keep all follow-up visits as told by your health care provider. This is important. You may still need additional treatment.  Do not take baths, swim, or use a hot tub until your health care provider approves. Shower and wash the area behind the scrotum gently.  Do not have sex for one week after the treatment, or until your health care provider approves.  If you have permanent, low-dose brachytherapy implants: ? Limit close contact with children and pregnant women for 2 months or as told by your health care provider. This is important because of the radiation that is still active in the prostate. ? You may set off radioactive sensors, such as airport screenings. Ask your health care provider for a document that explains  your treatment. ? You may be instructed to use a condom during sex for the first 2 months after low-dose brachytherapy. Contact a health care provider if:  You have a fever or chills.  You do not have a bowel movement for 3-4 days after the procedure.  You have diarrhea for 3-4 days after the procedure.  You develop any new symptoms, such as problems with urinating or erectile dysfunction.  You have abdomen (abdominal) pain.  You have more blood  in your urine. Get help right away if:  You cannot urinate.  There is excessive bleeding from your rectum.  You have unusual drainage coming from your rectum.  You have severe pain in the treated area that does not go away with pain medicine.  You have severe nausea or vomiting. Summary  If you have permanent, low-dose brachytherapy implants, limit close contact with children and pregnant women for 2 months or as told by your health care provider. This is important because of the radiation that is still active in the prostate.  Talk with your health care provider about your risk of brachytherapy side effects, such as erectile dysfunction or urinary problems. Your health care provider will be able to recommend possible treatment options.  Keep all follow-up visits as told by your health care provider. This is important. You may need additional treatment. This information is not intended to replace advice given to you by your health care provider. Make sure you discuss any questions you have with your health care provider. Document Released: 12/21/2010 Document Revised: 10/31/2017 Document Reviewed: 12/20/2016 Elsevier Patient Education  2020 [employer].

## 2019-08-30 NOTE — Anesthesia Procedure Notes (Signed)
Procedure Name: Intubation Performed by: Rolla Plate, CRNA Pre-anesthesia Checklist: Patient identified, Patient being monitored, Timeout performed, Emergency Drugs available and Suction available Patient Re-evaluated:Patient Re-evaluated prior to induction Oxygen Delivery Method: Circle system utilized Preoxygenation: Pre-oxygenation with 100% oxygen Induction Type: IV induction Ventilation: Mask ventilation without difficulty Laryngoscope Size: 3, McGraph and 4 Grade View: Grade I Tube type: Oral Tube size: 7.0 mm Number of attempts: 1 Airway Equipment and Method: Stylet and Video-laryngoscopy Placement Confirmation: ETT inserted through vocal cords under direct vision,  positive ETCO2 and breath sounds checked- equal and bilateral Secured at: 22 cm Tube secured with: Tape Dental Injury: Teeth and Oropharynx as per pre-operative assessment

## 2019-08-30 NOTE — Anesthesia Post-op Follow-up Note (Signed)
Anesthesia QCDR form completed.        

## 2019-08-30 NOTE — Progress Notes (Signed)
Radiation Oncology I-125 interstitial implant  Note  Name: Shawn Melton   Date:   07/06/2019 MRN:  [id number] DOB: 01-29-1956    This 63 y.o. 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 provider found  HPI: Patient is a 63 year old male with Gleason 9 (4+5) adenocarcinoma the prostate presenting with a PSA of 7.1.  Initially he was taken for prostatectomy although found to have disease extending in the pelvis making surgery impossible.  We treated with external beam radiation therapy to both his prostate and pelvic nodes he is seen today in the hospital for I-125 interstitial implant.  Patient also is currently on androgen deprivation therapy..  COMPLICATIONS OF TREATMENT: none  FOLLOW UP COMPLIANCE: keeps appointments   PHYSICAL EXAM:  There were no vitals taken for this visit. Well-developed well-nourished patient in NAD. HEENT reveals PERLA, EOMI, discs not visualized.  Oral cavity is clear. No oral mucosal lesions are identified. Neck is clear without evidence of cervical or supraclavicular adenopathy. Lungs are clear to A&P. Cardiac examination is essentially unremarkable with regular rate and rhythm without murmur rub or thrill. Abdomen is benign with no organomegaly or masses noted. Motor sensory and DTR levels are equal and symmetric in the upper and lower extremities. Cranial nerves II through XII are grossly intact. Proprioception is intact. No peripheral adenopathy or edema is identified. No motor or sensory levels are noted. Crude visual fields are within normal range.  RADIOLOGY RESULTS: Ultrasound used for source placement  PLAN: Patient was taken to the operating room and general anesthesia was administered. Legs were immobilized in stirrups and patient was positioned in the exact same proportions as original volume study.  Patient was prepped and Foley catheter was placed. Ultrasound guidance identified the prostate and recreated the original set up as per treatment planning volume study. 26 needles were placed under ultrasound guidance with PVCs delivered to the prostate volume.  A total of 86 seeds were placed and counted after completion of procedure cystoscopy was performed by urology and no evidence of seeds in the bladder were noted. Patient tolerated the procedure extremely well. Initial plain film as doublecheck identified 80 seeds in the prostate. Patient has followup appointment in one month for CT scan for quality assurance will be performed.    Noreene Filbert, MD

## 2019-08-30 NOTE — Progress Notes (Signed)
Cipro prescription printed in PACU. Dr. Erlene Quan notified to sign but MD is in surgery. Per Dr. Erlene Quan, she will escribe prescription to [facility], [place].  Pt aware to pick up cipro prescription along with others escribed.

## 2019-08-30 NOTE — Interval H&P Note (Signed)
History and Physical Interval Note:  08/30/2019 7:18 AM   [facility] CTAB  Presents today for seed implantation  Shawn Melton  has presented today for surgery, with the diagnosis of PROSTATE CANCER.  The various methods of treatment have been discussed with the patient and family. After consideration of risks, benefits and other options for treatment, the patient has consented to  Procedure(s): RADIOACTIVE SEED IMPLANT/BRACHYTHERAPY IMPLANT (N/A) as a surgical intervention.  The patient's history has been reviewed, patient examined, no change in status, stable for surgery.  I have reviewed the patient's chart and labs.  Questions were answered to the patient's satisfaction.     Hollice Espy

## 2019-08-31 ENCOUNTER — Telehealth: Payer: Self-pay | Admitting: Urology

## 2019-08-31 ENCOUNTER — Encounter: Payer: Self-pay | Admitting: Urology

## 2019-08-31 NOTE — Telephone Encounter (Signed)
Pt.plans to  Leave for beach trip tomorrow and he has post op questions and concerns. Pt. Had surgery yesterday 08/30/19.

## 2019-08-31 NOTE — Telephone Encounter (Signed)
Patient wanted to know if it was still ok to take trip to the beach or should he stay close by incase of the possibility of rentention? He is very concerned if this will happen. He denies any urinary issues or symptoms.

## 2019-08-31 NOTE — Telephone Encounter (Signed)
Informed patient; verbalized understanding.

## 2019-08-31 NOTE — Telephone Encounter (Signed)
As per our discussion yesterday, if he is voiding fine today, I am fine with him going.  If he is getting going to retention he probably would have already.  If he does go under a digital at the beach, he can just go to the emergency room and get a catheter placed.  Hollice Espy, MD

## 2019-09-01 ENCOUNTER — Encounter: Payer: Self-pay | Admitting: Urology

## 2019-09-14 ENCOUNTER — Encounter: Payer: Self-pay | Admitting: Radiation Oncology

## 2019-09-21 ENCOUNTER — Other Ambulatory Visit: Payer: Self-pay | Admitting: Urology

## 2019-09-24 ENCOUNTER — Other Ambulatory Visit: Payer: Self-pay

## 2019-09-27 ENCOUNTER — Ambulatory Visit
Admission: RE | Admit: 2019-09-27 | Discharge: 2019-09-27 | Disposition: A | Discharge status: Home / Self Care | Payer: BC Managed Care – PPO | Source: Ambulatory Visit | Attending: Radiation Oncology | Admitting: Radiation Oncology

## 2019-09-27 ENCOUNTER — Encounter: Payer: Self-pay | Admitting: Radiation Oncology

## 2019-09-27 ENCOUNTER — Other Ambulatory Visit: Payer: Self-pay | Admitting: *Deleted

## 2019-09-27 ENCOUNTER — Other Ambulatory Visit: Payer: Self-pay

## 2019-09-27 VITALS — BP 160/99 | HR 83 | Temp 98.0°F | Resp 16 | Wt 199.3 lb

## 2019-09-27 DIAGNOSIS — Z923 Personal history of irradiation: Secondary | ICD-10-CM | POA: Insufficient documentation

## 2019-09-27 DIAGNOSIS — R351 Nocturia: Secondary | ICD-10-CM | POA: Diagnosis not present

## 2019-09-27 DIAGNOSIS — C61 Malignant neoplasm of prostate: Secondary | ICD-10-CM

## 2019-09-27 DIAGNOSIS — Z510 Encounter for antineoplastic radiation therapy: Secondary | ICD-10-CM | POA: Insufficient documentation

## 2019-09-27 NOTE — Progress Notes (Signed)
Radiation Oncology Follow up Note  Name: Shawn Melton   Date:   09/27/2019 MRN:  [id number] DOB: 08/21/1956    This 63 y.o. male presents to the clinic today for 1 month follow-up status post I-125 interstitial implant for boost to his prostate after completing external beam radiation to the prostate and pelvic nodes for stage III (T3b N0 M0 Gleason 9 (4+5) adenocarcinoma the prostate presenting with a PSA of 7.1..  REFERRING PROVIDER: Baxter Hire, MD  HPI: Patient is a 63 year old male now seen at 1 month having completed I-125 interstitial implant for stage III adenocarcinoma the prostate Gleason score of 9 (4+5).  Seen today in follow-up he is doing well still having some nocturia x3.  No specific urgency or frequency.  He states his energy level is low his desire for sex is also diminished.  He continues on androgen deprivation therapy which may account for some of those symptoms..  CT scan for quality assurance on his implant was performed today  COMPLICATIONS OF TREATMENT: none  FOLLOW UP COMPLIANCE: keeps appointments   PHYSICAL EXAM:  BP (!) 160/99 (BP Location: Left Arm, Patient Position: Sitting)   Pulse 83   Temp 98 F (36.7 C)   Resp 16   Wt 199 lb 4.8 oz (90.4 kg)   BMI 32.17 kg/m  Well-developed well-nourished patient in NAD. HEENT reveals PERLA, EOMI, discs not visualized.  Oral cavity is clear. No oral mucosal lesions are identified. Neck is clear without evidence of cervical or supraclavicular adenopathy. Lungs are clear to A&P. Cardiac examination is essentially unremarkable with regular rate and rhythm without murmur rub or thrill. Abdomen is benign with no organomegaly or masses noted. Motor sensory and DTR levels are equal and symmetric in the upper and lower extremities. Cranial nerves II through XII are grossly intact. Proprioception is intact. No peripheral adenopathy or edema is identified. No motor or sensory levels are noted. Crude visual fields are within  normal range.  RADIOLOGY RESULTS: CT scan for quality assurance performed today and reviewed  PLAN: Present time patient is doing well very low side effect profile from his both external beam treatment as well as I-125 interstitial implant for boost.  I am pleased with his overall progress I have assured him some of the fatigue and sexual desire issues will resolve in the near future.  He is still radioactive from his seed implant.  I have asked to see him back in 3 to 4 months for follow-up with a PSA prior to that visit.  He continues close follow-up care and continued androgen deprivation therapy through Dr. Cherrie Gauze office.  Patient knows to call with any concerns.  I would like to take this opportunity to thank you for allowing me to participate in the care of your patient.Noreene Filbert, MD

## 2019-09-28 DIAGNOSIS — C61 Malignant neoplasm of prostate: Secondary | ICD-10-CM | POA: Diagnosis present

## 2019-09-28 DIAGNOSIS — Z510 Encounter for antineoplastic radiation therapy: Secondary | ICD-10-CM | POA: Diagnosis not present

## 2019-09-29 DIAGNOSIS — C61 Malignant neoplasm of prostate: Secondary | ICD-10-CM | POA: Diagnosis not present

## 2019-10-05 ENCOUNTER — Ambulatory Visit (INDEPENDENT_AMBULATORY_CARE_PROVIDER_SITE_OTHER): Payer: BC Managed Care – PPO | Admitting: Urology

## 2019-10-05 ENCOUNTER — Other Ambulatory Visit: Payer: Self-pay

## 2019-10-05 ENCOUNTER — Encounter: Payer: Self-pay | Admitting: Urology

## 2019-10-05 VITALS — BP 154/102 | HR 86 | Ht 66.0 in | Wt 200.0 lb

## 2019-10-05 DIAGNOSIS — C61 Malignant neoplasm of prostate: Secondary | ICD-10-CM | POA: Diagnosis not present

## 2019-10-05 DIAGNOSIS — R350 Frequency of micturition: Secondary | ICD-10-CM

## 2019-10-05 LAB — BLADDER SCAN AMB NON-IMAGING

## 2019-10-05 NOTE — Progress Notes (Signed)
10/05/2019 1:57 PM   Shawn Melton [birth year] [id number]  Referring provider: Baxter Hire, MD [place],  [place] [address]  Chief Complaint  Patient presents with  . 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  He returns today for routine follow-up and PSA check.  Last Lupron given 06/09/2019.  He reports today that initially after the seeds were implanted, he was having some frequency and urgency.  He was getting up 3-4 times at night along with not able to hold it much during the daytime if needed.  The symptoms are subsiding.  He now has fairly normal daytime frequency and urgency.  He is only getting up 1-2 times which is dramatic improvement.  He is continue to take Flomax which was prescribed per procedurally.  He is wondering if he needs to continue this medication.  In terms of ADT, his hot flashes are manageable.  He does feel like he is had an increase in appetite and is gaining some central weight.  He also reports that he has been feeling slightly more blue in the afternoon and evenings.  He wonders if this is related to the medication.  He has decreased energy.  He is taking calcium and vitamin D supplements.  PSA today is pending.  IPSS    Row Name 10/05/19 1300         International Prostate Symptom Score   How often have you had the sensation of not emptying your bladder?  Not at All     How often have you had to urinate less than every two hours?  About half the time     How often have you found you stopped and started again several times when you urinated?  Less than 1 in 5 times     How often have you found it difficult to postpone urination?  Not at All     How often have you had a weak urinary stream?  Less than 1 in 5 times     How often  have you had to strain to start urination?  Not at All     How many times did you typically get up at night to urinate?  2 Times     Total IPSS Score  7       Quality of Life due to urinary symptoms   If you were to spend the rest of your life with your urinary condition just the way it is now how would you feel about that?  Mostly Disatisfied        Score:  1-7 Mild 8-19 Moderate 20-35 Severe   PMH: Past Medical History:  Diagnosis Date  . Chronic ankle pain   . DJD (degenerative joint disease)   . GERD (gastroesophageal reflux disease)   . H/O endoscopy   . History of stomach ulcers   . Hx of colonoscopy   . Hypertension   . Obesity   . Reflux   . Skin cancer   . Sleep apnea    using upper/lower mouth piece since 2016 ( not using CPAP)     Surgical History: Past Surgical History:  Procedure Laterality Date  . ABSCESS DRAINAGE    . RADIOACTIVE SEED IMPLANT N/A 08/30/2019   Procedure: RADIOACTIVE SEED IMPLANT/BRACHYTHERAPY IMPLANT;  Surgeon: Hollice Espy,  MD;  Location: [facility] ORS;  Service: Urology;  Laterality: N/A;  . ROBOTIC PELVIC AND PARA-AORTIC LYMPH NODE DISSECTION Left 05/31/2019   Procedure: XI ROBOTIC PELVIC lymph NODE DISSECTION;  Surgeon: Hollice Espy, MD;  Location: [facility] ORS;  Service: Urology;  Laterality: Left;    Home Medications:  Allergies as of 10/05/2019      Reactions   Neosporin [bacitracin-polymyxin B] Itching, Other (See Comments)   redness      Medication List       Accurate as of October 05, 2019  1:57 PM. If you have any questions, ask your nurse or doctor.        STOP taking these medications   docusate sodium 100 MG capsule Commonly known as: COLACE Stopped by: Hollice Espy, MD     TAKE these medications   ALPRAZolam 0.5 MG tablet Commonly known as: XANAX Take 0.25 mg by mouth at bedtime as needed for sleep.   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   fluticasone 50 MCG/ACT nasal spray Commonly known as: FLONASE Place 2 sprays into both nostrils daily as needed for allergies or rhinitis.   ibuprofen 200 MG tablet Commonly known as: ADVIL Take 400 mg by mouth every 8 (eight) hours as needed for moderate pain.   losartan 50 MG tablet Commonly known as: COZAAR Take 50 mg by mouth at bedtime.   multivitamin with minerals Tabs tablet Take 1 tablet by mouth daily.   omeprazole 40 MG capsule Commonly known as: PRILOSEC Take 40 mg by mouth daily before breakfast.   tamsulosin 0.4 MG Caps capsule Commonly known as: FLOMAX TAKE 1 CAPSULE BY MOUTH EVERY DAY       Allergies:  Allergies  Allergen Reactions  . Neosporin [Bacitracin-Polymyxin B] Itching and Other (See Comments)    redness    Family History: Family History  Problem Relation Age of Onset  . Diabetes Father   . Stroke Father   . Hypertension Mother   . Colon cancer Paternal Uncle   . Prostate cancer Cousin     Social History:  reports that he has never smoked. He has never used smokeless tobacco. He reports that he does not drink alcohol or use drugs.  ROS: UROLOGY Frequent Urination?: Yes Hard to postpone urination?: No Burning/pain with urination?: No Get up at night to urinate?: Yes Leakage of urine?: No Urine stream starts and stops?: No Trouble starting stream?: No Do you have to strain to urinate?: No Blood in urine?: No Urinary tract infection?: No Sexually transmitted disease?: No Injury to kidneys or bladder?: No Painful intercourse?: No Weak stream?: No Erection problems?: No Penile pain?: No  Gastrointestinal Nausea?: No Vomiting?: No Indigestion/heartburn?: No Diarrhea?: No Constipation?: No  Constitutional Fever: No Night sweats?: Yes Weight loss?: No Fatigue?: No  Skin Skin rash/lesions?: No Itching?: No  Eyes Blurred vision?: No Double vision?: No  Ears/Nose/Throat Sore throat?: No Sinus  problems?: No  Hematologic/Lymphatic Swollen glands?: No Easy bruising?: No  Cardiovascular Leg swelling?: No Chest pain?: No  Respiratory Cough?: No Shortness of breath?: No  Endocrine Excessive thirst?: No  Musculoskeletal Back pain?: No Joint pain?: No  Neurological Headaches?: No Dizziness?: No  Psychologic Depression?: No Anxiety?: No  Physical Exam: BP (!) 154/102   Pulse 86   Ht 5\' 6"  (1.676 m)   Wt 200 lb (90.7 kg)   BMI 32.28 kg/m   Constitutional:  Alert and  oriented, No acute distress. HEENT: [place] AT, moist mucus membranes.  Trachea midline, no masses. Cardiovascular: No clubbing, cyanosis, or edema. Respiratory: Normal respiratory effort, no increased work of breathing. Skin: No rashes, bruises or suspicious lesions. Neurologic: Grossly intact, no focal deficits, moving all 4 extremities. Psychiatric: Normal mood and affect.  Laboratory Data: Lab Results  Component Value Date   WBC 6.7 08/23/2019   HGB 12.9 (L) 08/23/2019   HCT 38.0 (L) 08/23/2019   MCV 89.6 08/23/2019   PLT 257 08/23/2019    Lab Results  Component Value Date   CREATININE 0.98 06/01/2019    Lab Results  Component Value Date   TESTOSTERONE 236 (L) 12/07/2015    No results found for: HGBA1C  Urinalysis    Component Value Date/Time   APPEARANCEUR Clear 05/24/2019 1003   GLUCOSEU Negative 05/24/2019 1003   BILIRUBINUR Negative 05/24/2019 1003   PROTEINUR Negative 05/24/2019 1003   NITRITE Negative 05/24/2019 1003   LEUKOCYTESUR Negative 05/24/2019 1003    Lab Results  Component Value Date   LABMICR See below: 05/24/2019   WBCUA 0-5 05/24/2019   LABEPIT None seen 05/24/2019   BACTERIA None seen 05/24/2019    Pertinent Imaging: Results for orders placed or performed in visit on 10/05/19  BLADDER SCAN AMB NON-IMAGING  Result Value Ref Range   Scan Result 30ml      Assessment & Plan:    1. Prostate cancer ([place]) High risk prostate cancer now status post  IMRT with brachii therapy boost and ADT for 2 to 3 years  Plan to continue ADT, symptoms are manageable.  Continue to encourage weightbearing exercises and calcium vitamin D supplementation.  We discussed supply chain issues for Lupron brand, will transition to Eligard to which he is agreeable.  He would due for this in January.  We will try to coordinate his follow-up visits with me thereafter on a 49-month basis with PSA prior.  We will plan to recheck his PSA today.  He had lots of questions today regarding how his PSA will continue to be followed as well as the definition of cancer free.  All of these were answered in detail.  In addition to the above, he reports that he is feeling a little bit blue/lower energy and not sure whether or not he may be depressed.  I encouraged him to discuss this further with his primary care physician, he is also reasons to have adjustment disorder in light of stressors as well as cancer diagnosis.  It is unclear whether or not meets the criteria for depression at this point and whether or not he would like to pursue options for this.  No suicidal ideation.  - BLADDER SCAN AMB NON-IMAGING - PSA  2. Urinary frequency Improving urinary symptoms, may discontinue Flomax as needed  Follow-up in January for ADT with a nurse, July with PSA prior/ADT  Hollice Espy, MD  [facility] [address] [place], [place] [address] [phone]  I spent 25 min with this patient of which greater than 50% was spent in counseling and coordination of care with the patient.

## 2019-10-06 LAB — PSA: Prostate Specific Ag, Serum: 0.2 ng/mL (ref 0.0–4.0)

## 2019-11-05 ENCOUNTER — Encounter: Payer: Self-pay | Admitting: Urology

## 2019-11-08 ENCOUNTER — Other Ambulatory Visit: Payer: Self-pay | Admitting: *Deleted

## 2019-11-08 MED ORDER — TAMSULOSIN HCL 0.4 MG PO CAPS: 0.4000 mg | ORAL_CAPSULE | Freq: Every day | ORAL | 0 refills | Status: DC

## 2019-11-08 NOTE — Progress Notes (Signed)
Fkomax sent to [facility] .

## 2019-12-02 ENCOUNTER — Other Ambulatory Visit: Payer: Self-pay | Admitting: Physician Assistant

## 2019-12-07 ENCOUNTER — Ambulatory Visit (INDEPENDENT_AMBULATORY_CARE_PROVIDER_SITE_OTHER): Payer: BC Managed Care – PPO

## 2019-12-07 ENCOUNTER — Other Ambulatory Visit: Payer: Self-pay

## 2019-12-07 DIAGNOSIS — C61 Malignant neoplasm of prostate: Secondary | ICD-10-CM

## 2019-12-07 MED ORDER — LEUPROLIDE ACETATE (6 MONTH) 45 MG ~~LOC~~ KIT
45.0000 mg | PACK | Freq: Once | SUBCUTANEOUS | Status: AC
  Administered 2019-12-07: 45 mg via SUBCUTANEOUS

## 2019-12-07 NOTE — Progress Notes (Signed)
Eligard SubQ Injection   Due to Prostate Cancer patient is present today for a Eligard Injection.  Medication: Eligard 6 month Dose: 45 mg  Location: right upper abdomen Lot: [id number] Exp: 08/2021  Patient tolerated well, no complications were noted  Performed by: Bradly Bienenstock, [place]  Per Dr. Erlene Quan, patient is to continue therapy for 2 years. Patient's next follow up was scheduled for July 7th. This appointment was scheduled using wheel and given to patient today along with reminder continue on Vitamin D 800-1000iu and Calium 1000-1200mg  daily while on Androgen Deprivation Therapy.

## 2019-12-17 DIAGNOSIS — F320 Major depressive disorder, single episode, mild: Secondary | ICD-10-CM | POA: Insufficient documentation

## 2019-12-22 ENCOUNTER — Other Ambulatory Visit: Payer: BC Managed Care – PPO

## 2019-12-28 ENCOUNTER — Encounter: Payer: Self-pay | Admitting: Radiation Oncology

## 2019-12-28 ENCOUNTER — Other Ambulatory Visit: Payer: Self-pay

## 2019-12-28 NOTE — Progress Notes (Signed)
Patient pre screened for office appointment, no questions or concerns today. Patient reminded of upcoming appointment time and date. 

## 2019-12-29 ENCOUNTER — Other Ambulatory Visit: Payer: Self-pay

## 2019-12-29 ENCOUNTER — Ambulatory Visit
Admission: RE | Admit: 2019-12-29 | Discharge: 2019-12-29 | Disposition: A | Discharge status: Home / Self Care | Payer: BC Managed Care – PPO | Source: Ambulatory Visit | Attending: Radiation Oncology | Admitting: Radiation Oncology

## 2019-12-29 DIAGNOSIS — C774 Secondary and unspecified malignant neoplasm of inguinal and lower limb lymph nodes: Secondary | ICD-10-CM | POA: Diagnosis not present

## 2019-12-29 DIAGNOSIS — Z923 Personal history of irradiation: Secondary | ICD-10-CM | POA: Insufficient documentation

## 2019-12-29 DIAGNOSIS — C61 Malignant neoplasm of prostate: Secondary | ICD-10-CM

## 2019-12-29 NOTE — Progress Notes (Signed)
Radiation Oncology Follow up Note  Name: Shawn Melton   Date:   12/29/2019 MRN:  [id number] DOB: 1956/11/17    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  REFERRING PROVIDER: Baxter Hire, MD  HPI: Patient is a 64 year old male now out 4 months having completed both external beam radiation therapy to his prostate and pelvic nodes plus an I-125 interstitial implant for boost for a stage III Gleason 9 adenocarcinoma of the prostate.  Seen today in routine follow-up he is doing fairly well he states without Flomax he is having nocturia x3-4+ frequency.  I have asked him to stay on his Flomax.  He specifically denies the diarrhea or any other GI disturbance.  His most recent PSA in November was 0.2.  He is currently on androgen deprivation therapy..  COMPLICATIONS OF TREATMENT: none  FOLLOW UP COMPLIANCE: keeps appointments   PHYSICAL EXAM:  BP (!) (P) 152/96 (BP Location: Left Arm, Patient Position: Sitting)   Pulse (P) 80   Temp (!) (P) 97 F (36.1 C) (Tympanic)   Resp (P) 16   Wt (P) 206 lb 6.4 oz (93.6 kg)   BMI (P) 33.31 kg/m  Well-developed well-nourished patient in NAD. HEENT reveals PERLA, EOMI, discs not visualized.  Oral cavity is clear. No oral mucosal lesions are identified. Neck is clear without evidence of cervical or supraclavicular adenopathy. Lungs are clear to A&P. Cardiac examination is essentially unremarkable with regular rate and rhythm without murmur rub or thrill. Abdomen is benign with no organomegaly or masses noted. Motor sensory and DTR levels are equal and symmetric in the upper and lower extremities. Cranial nerves II through XII are grossly intact. Proprioception is intact. No peripheral adenopathy or edema is  identified. No motor or sensory levels are noted. Crude visual fields are within normal range.  RADIOLOGY RESULTS: No current films to review  PLAN: Present time patient is doing well I have suggested he stays on Flomax for the time being to help with some of his urinary issues.  That does seem to be beneficial.  I am pleased with his PSA at 0.2.  He continues on androgen deprivation therapy and I have again explained to him the rationale behind that and I would like to keep him suppressed the least a year and 1/2 to 2 years.  I have asked to see him back in 6 months for follow-up PSA at that time if it has not been performed.  Patient knows to call with any concerns.  I would like to take this opportunity to thank you for allowing me to participate in the care of your patient.Noreene Filbert, MD

## 2020-01-13 IMAGING — CT CT ABDOMEN AND PELVIS WITH CONTRAST
2 of 5 series · 16 of 46 positions shown, 18 images · IV contrast (APPLIED)
Comparison: 01/10/2014

CLINICAL DATA: Newly diagnosed prostate carcinoma. Staging

EXAM:
CT ABDOMEN AND PELVIS WITH CONTRAST
TECHNIQUE: Multidetector CT imaging of the abdomen and pelvis was performed
using the standard protocol following bolus administration of
intravenous contrast.
CONTRAST:  100mL OMNIPAQUE IOHEXOL 300 MG/ML  SOLN

[Series 2: routine abd/pel with · axial · 0.77mm/px · z∈[-506,-81]mm · 13 of 97 slices shown, 15 images]
[im 6/97  soft-tissue]
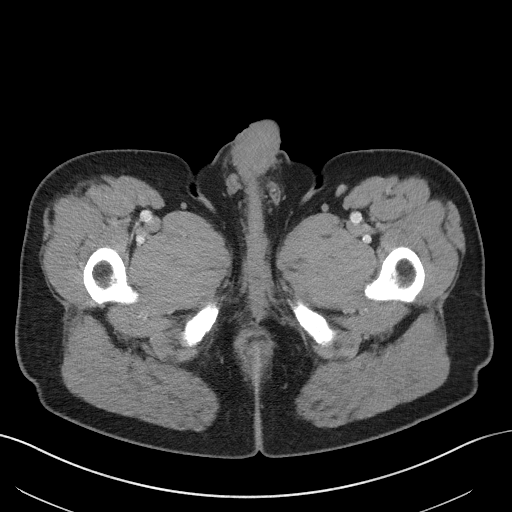
[im 6/97  bone]
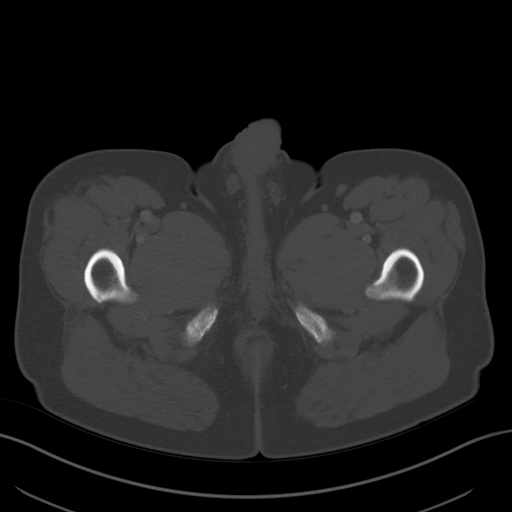
[im 16/97  soft-tissue]
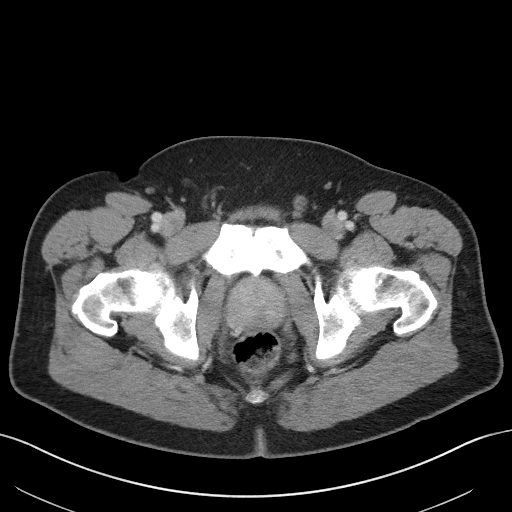
[im 21/97  soft-tissue]
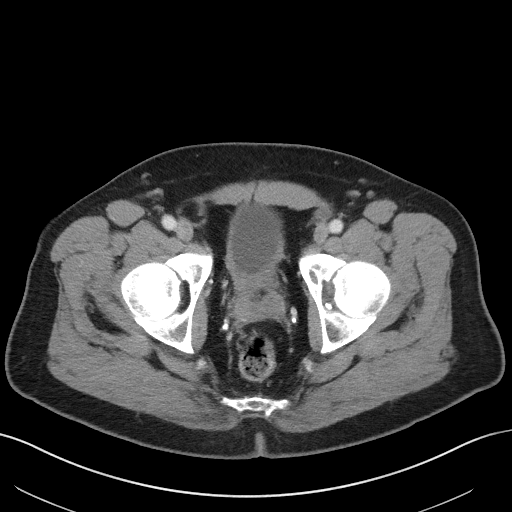
[im 26/97  soft-tissue]
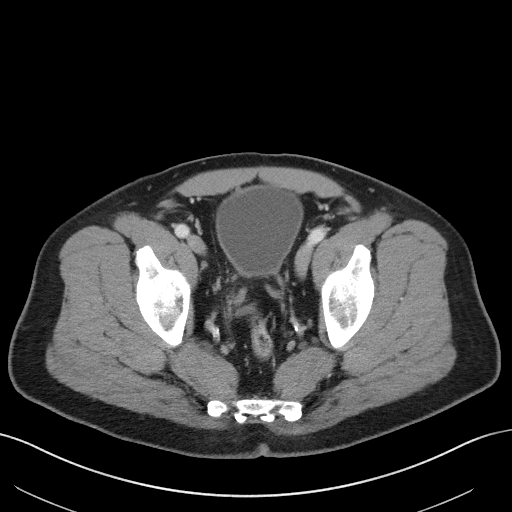
[im 36/97  soft-tissue]
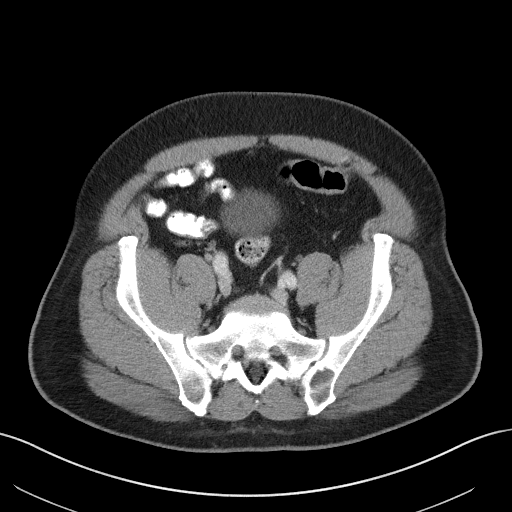
[im 41/97  soft-tissue]
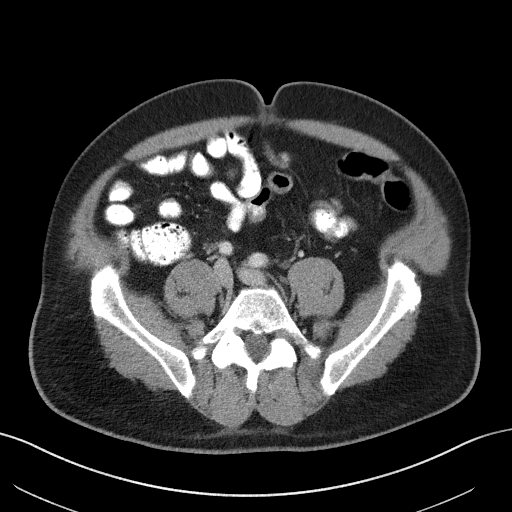
[im 51/97  soft-tissue]
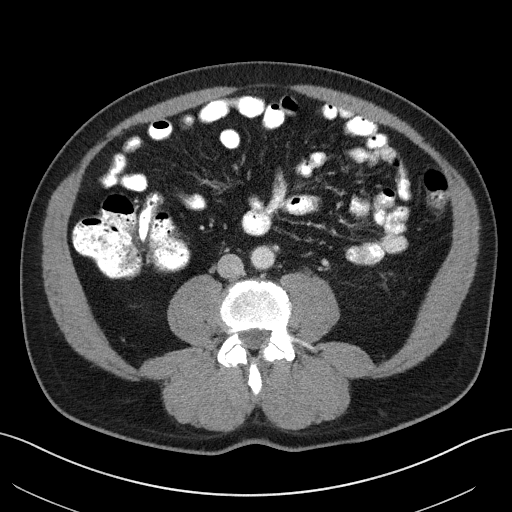
[im 56/97  soft-tissue]
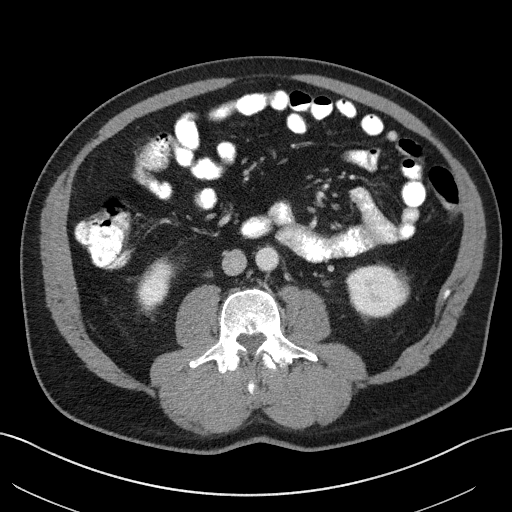
[im 61/97  soft-tissue]
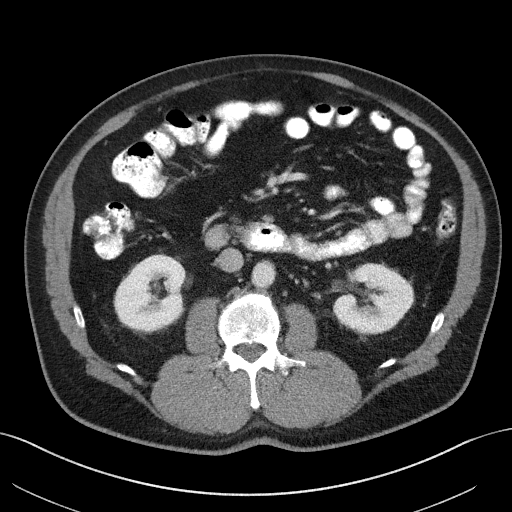
[im 61/97  bone]
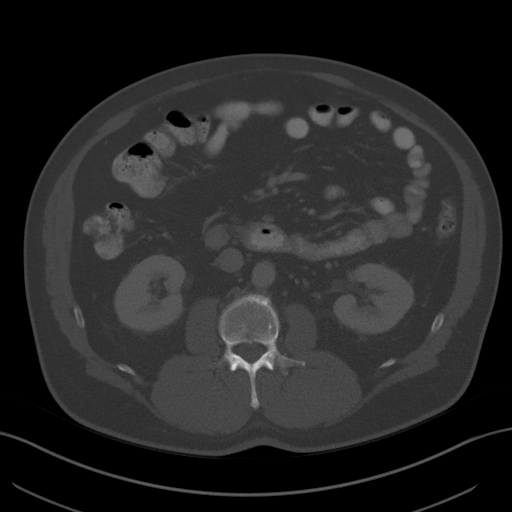
[im 71/97  soft-tissue]
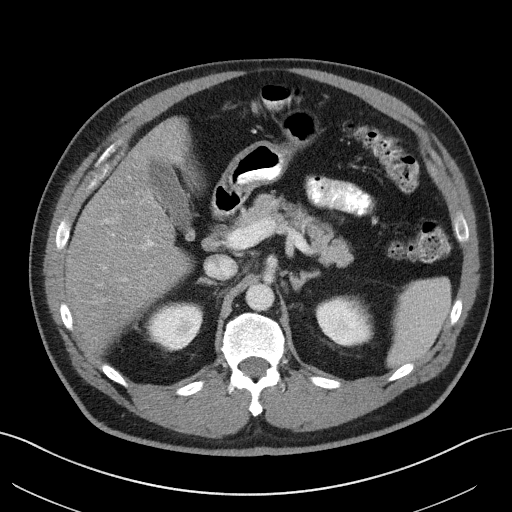
[im 76/97  soft-tissue]
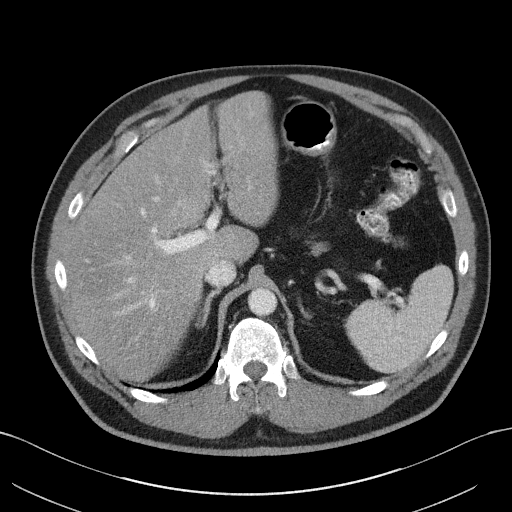
[im 81/97  soft-tissue]
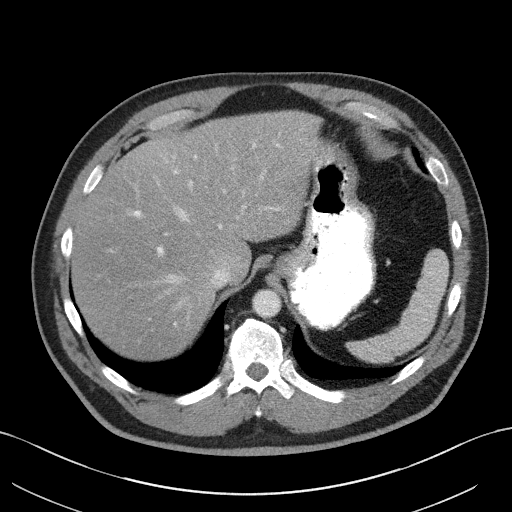
[im 91/97  soft-tissue]
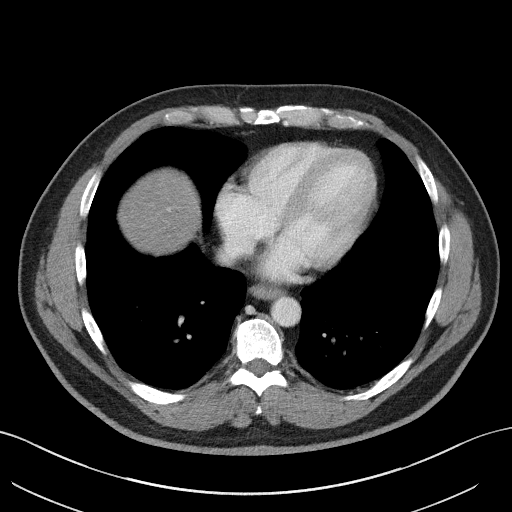

[Series 5: coronal st · coronal · 0.80mm/px · 3 of 103 slices shown]
[im 35/103  soft-tissue]
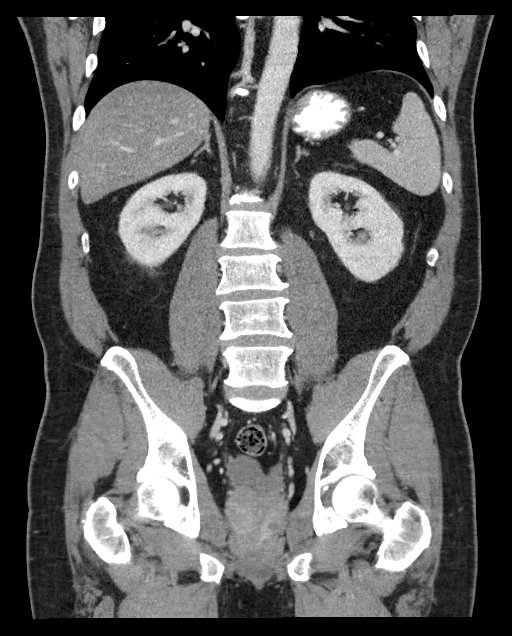
[im 46/103  soft-tissue]
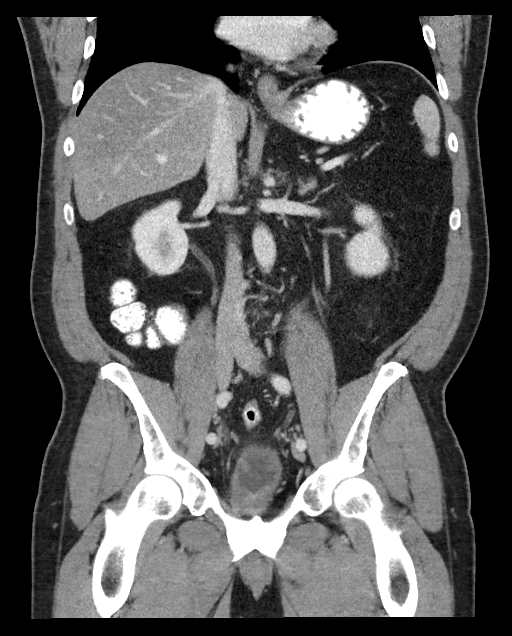
[im 57/103  soft-tissue]
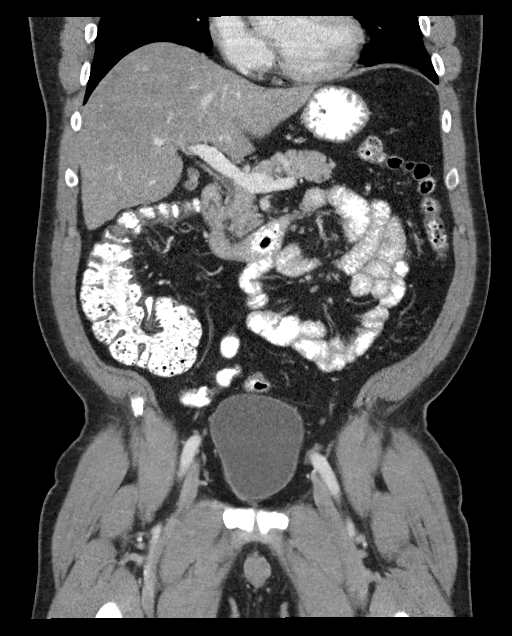

[16 of 46 positions shown; findings below may reference images not displayed]

FINDINGS: Lower Chest: No acute findings.

Hepatobiliary: No hepatic masses identified. Stable moderate
steatosis. Gallbladder is unremarkable. No evidence of biliary
ductal dilatation.

Pancreas:  No mass or inflammatory changes.

Spleen: Within normal limits in size and appearance.

Adrenals/Urinary Tract: No masses identified. No evidence of
hydronephrosis.

Stomach/Bowel: No evidence of obstruction, inflammatory process or
abnormal fluid collections. Normal appendix visualized.

Vascular/Lymphatic: No pathologically enlarged lymph nodes. No
abdominal aortic aneurysm.

Reproductive: Enlarged prostate. Asymmetric enlargement of right
seminal vesicle is suspicious for tumor invasion.

Other:  None.

Musculoskeletal:  No suspicious bone lesions identified.
IMPRESSION: 1. Mildly enlarged prostate. Asymmetric enlargement of right seminal
vesicle, suspicious for tumor invasion.
2. No other sites of metastatic disease identified within the
abdomen or pelvis.
3. Stable hepatic steatosis.

## 2020-02-21 ENCOUNTER — Other Ambulatory Visit: Payer: Self-pay | Admitting: Physician Assistant

## 2020-02-26 ENCOUNTER — Ambulatory Visit: Payer: BC Managed Care – PPO

## 2020-05-25 ENCOUNTER — Telehealth: Payer: Self-pay

## 2020-05-25 NOTE — Telephone Encounter (Signed)
PA form submitted for Lupron/Eligard.

## 2020-06-01 ENCOUNTER — Other Ambulatory Visit: Payer: Self-pay

## 2020-06-01 ENCOUNTER — Encounter (INDEPENDENT_AMBULATORY_CARE_PROVIDER_SITE_OTHER): Payer: Self-pay | Admitting: Vascular Surgery

## 2020-06-01 ENCOUNTER — Ambulatory Visit (INDEPENDENT_AMBULATORY_CARE_PROVIDER_SITE_OTHER): Payer: Medicare PPO | Admitting: Vascular Surgery

## 2020-06-01 VITALS — BP 133/77 | HR 74 | Ht 66.0 in | Wt 204.0 lb

## 2020-06-01 DIAGNOSIS — M79604 Pain in right leg: Secondary | ICD-10-CM | POA: Diagnosis not present

## 2020-06-01 DIAGNOSIS — I10 Essential (primary) hypertension: Secondary | ICD-10-CM | POA: Diagnosis not present

## 2020-06-01 DIAGNOSIS — C61 Malignant neoplasm of prostate: Secondary | ICD-10-CM

## 2020-06-01 DIAGNOSIS — K219 Gastro-esophageal reflux disease without esophagitis: Secondary | ICD-10-CM | POA: Diagnosis not present

## 2020-06-01 DIAGNOSIS — M1991 Primary osteoarthritis, unspecified site: Secondary | ICD-10-CM | POA: Diagnosis not present

## 2020-06-01 DIAGNOSIS — M79605 Pain in left leg: Secondary | ICD-10-CM

## 2020-06-01 NOTE — Telephone Encounter (Signed)
Incoming fax from [facility] stating that the patient's insurance is currently inactive. Sent pt a message informing him of this and to contact us with new insurance as soon as possible.

## 2020-06-02 ENCOUNTER — Other Ambulatory Visit: Payer: Medicare PPO

## 2020-06-02 DIAGNOSIS — C61 Malignant neoplasm of prostate: Secondary | ICD-10-CM

## 2020-06-02 NOTE — Telephone Encounter (Signed)
Contacted pt (see mychart) asked that he bring in new insurance information. Pt dropped of new card to be scanned in, Eliagrd form resubmitted.

## 2020-06-03 LAB — PSA: Prostate Specific Ag, Serum: 0.6 ng/mL (ref 0.0–4.0)

## 2020-06-04 ENCOUNTER — Encounter (INDEPENDENT_AMBULATORY_CARE_PROVIDER_SITE_OTHER): Payer: Self-pay | Admitting: Vascular Surgery

## 2020-06-04 DIAGNOSIS — M79606 Pain in leg, unspecified: Secondary | ICD-10-CM | POA: Insufficient documentation

## 2020-06-04 NOTE — Progress Notes (Signed)
MRN : [id number]  Shawn Melton is a 64 y.o. ([birth year]) male who presents with chief complaint of  Chief Complaint  Patient presents with  . New Patient (Initial Visit)    Claudication  .  History of Present Illness:   The patient is seen for evaluation of painful lower extremities. Patient notes the pain is variable and not always associated with activity.  The pain is somewhat consistent day to day occurring on most days. The patient notes the pain also occurs with standing and routinely seems worse as the day wears on. The pain has been progressive over the past several years. The patient states these symptoms are causing  a profound negative impact on quality of life and daily activities.  The patient denies rest pain or dangling of an extremity off the side of the bed during the night for relief. No open wounds or sores at this time. No history of DVT or phlebitis. No prior interventions or surgeries.  There is a  history of back problems and DJD of the lumbar and sacral spine.    Current Meds  Medication Sig  . ibuprofen (ADVIL) 200 MG tablet Take 400 mg by mouth every 8 (eight) hours as needed for moderate pain.   Marland Kitchen losartan (COZAAR) 50 MG tablet Take 50 mg by mouth at bedtime.   . Multiple Vitamin (MULTIVITAMIN WITH MINERALS) TABS tablet Take 1 tablet by mouth daily.  Marland Kitchen omeprazole (PRILOSEC) 40 MG capsule Take 40 mg by mouth daily before breakfast.   . 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  . tamsulosin (FLOMAX) 0.4 MG CAPS capsule TAKE 1 CAPSULE BY MOUTH EVERY DAY    Past Medical History:  Diagnosis Date  . Chronic ankle pain   . DJD (degenerative joint disease)   . GERD (gastroesophageal reflux disease)   . H/O endoscopy   . History of stomach ulcers   . Hx of colonoscopy   . Hypertension   . Obesity   . Reflux   . Skin cancer   . Sleep apnea    using upper/lower mouth piece since 2016  ( not using CPAP)     Past Surgical History:  Procedure Laterality Date  . ABSCESS DRAINAGE    . RADIOACTIVE SEED IMPLANT N/A 08/30/2019   Procedure: RADIOACTIVE SEED IMPLANT/BRACHYTHERAPY IMPLANT;  Surgeon: Hollice Espy, MD;  Location: [facility] ORS;  Service: Urology;  Laterality: N/A;  . ROBOTIC PELVIC AND PARA-AORTIC LYMPH NODE DISSECTION Left 05/31/2019   Procedure: XI ROBOTIC PELVIC lymph NODE DISSECTION;  Surgeon: Hollice Espy, MD;  Location: [facility] ORS;  Service: Urology;  Laterality: Left;    Social History Social History   Tobacco Use  . Smoking status: Never Smoker  . Smokeless tobacco: Never Used  Vaping Use  . Vaping Use: Never used  Substance Use Topics  . Alcohol use: No    Alcohol/week: 0.0 standard drinks    Comment: occ  . Drug use: No    Family History Family History  Problem Relation Age of Onset  . Diabetes Father   . Stroke Father   . Hypertension Mother   . Colon cancer Paternal Uncle   . Prostate cancer Cousin     Allergies  Allergen Reactions  . Neosporin [Bacitracin-Polymyxin B] Itching and Other (See Comments)    redness     REVIEW OF SYSTEMS (Negative unless checked)  Constitutional: [] Weight loss  [] Fever  [] Chills Cardiac: []   Chest pain   [] Chest pressure   [] Palpitations   [] Shortness of breath when laying flat   [] Shortness of breath with exertion. Vascular:  [x] Pain in legs with walking   [x] Pain in legs at rest  [] History of DVT   [] Phlebitis   [] Swelling in legs   [] Varicose veins   [] Non-healing ulcers Pulmonary:   [] Uses home oxygen   [] Productive cough   [] Hemoptysis   [] Wheeze  [] COPD   [] Asthma Neurologic:  [] Dizziness   [] Seizures   [] History of stroke   [] History of TIA  [] Aphasia   [] Vissual changes   [] Weakness or numbness in arm   [] Weakness or numbness in leg Musculoskeletal:   [] Joint swelling   [] Joint pain   [] Low back pain Hematologic:  [] Easy bruising  [] Easy bleeding   [] Hypercoagulable state    [] Anemic Gastrointestinal:  [] Diarrhea   [] Vomiting  [] Gastroesophageal reflux/heartburn   [] Difficulty swallowing. Genitourinary:  [] Chronic kidney disease   [] Difficult urination  [] Frequent urination   [] Blood in urine Skin:  [] Rashes   [] Ulcers  Psychological:  [] History of anxiety   []  History of major depression.  Physical Examination  Vitals:   06/01/20 1515  BP: 133/77  Pulse: 74  Weight: 204 lb (92.5 kg)  Height: 5\' 6"  (1.676 m)   Body mass index is 32.93 kg/m. Gen: WD/WN, NAD Head: [place]/AT, No temporalis wasting.  Ear/Nose/Throat: Hearing grossly intact, nares w/o erythema or drainage Eyes: PER, EOMI, sclera nonicteric.  Neck: Supple, no large masses.   Pulmonary:  Good air movement, no audible wheezing bilaterally, no use of accessory muscles.  Cardiac: RRR, no JVD Vascular:  Vessel Right Left  Radial Palpable Palpable  PT Palpable Palpable  DP Palpable Palpable  Gastrointestinal: Non-distended. No guarding/no peritoneal signs.  Musculoskeletal: M/S 5/5 throughout.  No deformity or atrophy.  Neurologic: CN 2-12 intact. Symmetrical.  Speech is fluent. Motor exam as listed above. Psychiatric: Judgment intact, Mood & affect appropriate for pt's clinical situation. Dermatologic: No rashes or ulcers noted.  No changes consistent with cellulitis.  CBC Lab Results  Component Value Date   WBC 6.7 08/23/2019   HGB 12.9 (L) 08/23/2019   HCT 38.0 (L) 08/23/2019   MCV 89.6 08/23/2019   PLT 257 08/23/2019    BMET    Component Value Date/Time   NA 139 06/01/2019 0400   K 4.3 06/01/2019 0400   CL 107 06/01/2019 0400   CO2 24 06/01/2019 0400   GLUCOSE 131 (H) 06/01/2019 0400   BUN 15 06/01/2019 0400   CREATININE 0.98 06/01/2019 0400   CALCIUM 8.7 (L) 06/01/2019 0400   GFRNONAA >60 06/01/2019 0400   GFRAA >60 06/01/2019 0400   CrCl cannot be calculated (Patient's most recent lab result is older than the maximum 21 days allowed.).  COAG Lab Results  Component  Value Date   INR 1.0 05/27/2019    Radiology No results found.   Assessment/Plan 1. Primary osteoarthritis, unspecified site Recommend:  I do not find evidence of Vascular pathology that would explain the patient's symptoms  The patient has atypical pain symptoms for vascular disease  I do not find evidence of Vascular pathology that would explain the patient's symptoms and I suspect the patient is c/o pseudoclaudication.  Patient should have an evaluation of his LS spine which I defer to the primary service.  Noninvasive studies including venous ultrasound of the legs do not identify vascular problems  The patient should continue walking and begin a more formal exercise program. The patient  should continue his antiplatelet therapy and aggressive treatment of the lipid abnormalities. The patient should begin wearing graduated compression socks 15-20 mmHg strength to control her mild edema.  Patient will follow-up with me on a PRN basis  Further work-up of her lower extremity pain is deferred to the primary service     2. Pain in both lower extremities Recommend:  I do not find evidence of Vascular pathology that would explain the patient's symptoms  The patient has atypical pain symptoms for vascular disease  I do not find evidence of Vascular pathology that would explain the patient's symptoms and I suspect the patient is c/o pseudoclaudication.  Patient should have an evaluation of his LS spine which I defer to the primary service.  Noninvasive studies including venous ultrasound of the legs do not identify vascular problems  The patient should continue walking and begin a more formal exercise program. The patient should continue his antiplatelet therapy and aggressive treatment of the lipid abnormalities. The patient should begin wearing graduated compression socks 15-20 mmHg strength to control her mild edema.  Patient will follow-up with me on a PRN basis  Further  work-up of her lower extremity pain is deferred to the primary service     3. Essential hypertension Continue antihypertensive medications as already ordered, these medications have been reviewed and there are no changes at this time.   4. Gastroesophageal reflux disease without esophagitis Continue PPI as already ordered, this medication has been reviewed and there are no changes at this time.  Avoidence of caffeine and alcohol  Moderate elevation of the head of the bed    Hortencia Pilar, MD  06/04/2020 12:35 PM

## 2020-06-07 ENCOUNTER — Ambulatory Visit: Payer: Medicare PPO | Admitting: Urology

## 2020-06-08 NOTE — Telephone Encounter (Signed)
Incoming benefits confirmation. NO PA required for Eligard.

## 2020-06-12 NOTE — Progress Notes (Signed)
06/13/2020 7:48 PM   Shawn Melton 07/24/1956 [id number]  Referring provider: Baxter Hire, MD [place],  [place] [address] Chief Complaint  Patient presents with  . Follow-up  . Prostate Cancer    HPI: Shawn Melton is a 64 y.o. male with high risk prostate cancer who underwent aborted prostatectomy in 05/2019 due to frozen pelvis who ultimately is being treated on ADT plus EBRT with recent brachytherapy boost completed 08/22/2019. Returns today for a 6 month follow up.   Last Lupron given 10/2019.  Today the patient continues to experience hot flashes.   He feels fatigued at times and tries to stay active.   When walking he has bilateral leg pain which prohibits him from exercising. He was seen by vascular surgery and they found no cause to pain. He has arthritis in the lower back.   He is voiding well. Occasionally he has a weak stream and some nocturia.   He tried going a couple days with without Flomax for a few days and symptoms remained stable. He would like to stay on Flomax.  PSA Trend: Component     Latest Ref Rng & Units 07/24/2018 01/25/2019 07/02/2019 10/05/2019  Prostate Specific Ag, Serum     0.0 - 4.0 ng/mL 4.8 (H) 7.1 (H) 22.7 (H) 0.2   Component     Latest Ref Rng & Units 06/02/2020  Prostate Specific Ag, Serum     0.0 - 4.0 ng/mL 0.6    PMH: Past Medical History:  Diagnosis Date  . Chronic ankle pain   . DJD (degenerative joint disease)   . GERD (gastroesophageal reflux disease)   . H/O endoscopy   . History of stomach ulcers   . Hx of colonoscopy   . Hypertension   . Obesity   . Reflux   . Skin cancer   . Sleep apnea    using upper/lower mouth piece since 2016 ( not using CPAP)     Surgical History: Past Surgical History:  Procedure Laterality Date  . ABSCESS DRAINAGE    . RADIOACTIVE SEED IMPLANT N/A 08/30/2019   Procedure: RADIOACTIVE SEED IMPLANT/BRACHYTHERAPY IMPLANT;  Surgeon: Hollice Espy, MD;  Location: [facility]  ORS;  Service: Urology;  Laterality: N/A;  . ROBOTIC PELVIC AND PARA-AORTIC LYMPH NODE DISSECTION Left 05/31/2019   Procedure: XI ROBOTIC PELVIC lymph NODE DISSECTION;  Surgeon: Hollice Espy, MD;  Location: [facility] ORS;  Service: Urology;  Laterality: Left;    Home Medications:  Allergies as of 06/13/2020      Reactions   Neosporin [bacitracin-polymyxin B] Itching, Other (See Comments)   redness      Medication List       Accurate as of June 13, 2020  7:48 PM. If you have any questions, ask your nurse or doctor.        STOP taking these medications   ALPRAZolam 0.5 MG tablet Commonly known as: Duanne Moron Stopped by: Hollice Espy, MD   hydrochlorothiazide 12.5 MG tablet Commonly known as: HYDRODIURIL Stopped by: Hollice Espy, MD   sertraline 25 MG tablet Commonly known as: ZOLOFT Stopped by: Hollice Espy, MD     TAKE these medications   acyclovir 400 MG tablet Commonly known as: ZOVIRAX   aspirin 81 MG EC tablet Take by mouth.   Denavir 1 % cream Generic drug: penciclovir Apply 1 application topically daily as needed (cracks in skin from cold sore).   fluticasone 50 MCG/ACT nasal spray Commonly known as: FLONASE Place 2 sprays  into both nostrils daily as needed for allergies or rhinitis.   ibuprofen 200 MG tablet Commonly known as: ADVIL Take 400 mg by mouth every 8 (eight) hours as needed for moderate pain.   losartan 50 MG tablet Commonly known as: COZAAR Take 50 mg by mouth at bedtime.   multivitamin with minerals Tabs tablet Take 1 tablet by mouth daily.   omeprazole 40 MG capsule Commonly known as: PRILOSEC Take 40 mg by mouth daily before breakfast.   tamsulosin 0.4 MG Caps capsule Commonly known as: FLOMAX TAKE 1 CAPSULE BY MOUTH EVERY DAY       Allergies:  Allergies  Allergen Reactions  . Neosporin [Bacitracin-Polymyxin B] Itching and Other (See Comments)    redness    Family History: Family History  Problem Relation Age of Onset    . Diabetes Father   . Stroke Father   . Hypertension Mother   . Colon cancer Paternal Uncle   . Prostate cancer Cousin     Social History:  reports that he has never smoked. He has never used smokeless tobacco. He reports that he does not drink alcohol and does not use drugs.   Physical Exam: BP (!) 161/81 (BP Location: Left Arm, Patient Position: Sitting, Cuff Size: Large)   Pulse 80   Ht 5\' 6"  (1.676 m)   Wt 206 lb 6.4 oz (93.6 kg)   BMI 33.31 kg/m   Constitutional:  Alert and oriented, No acute distress. HEENT: [place] AT, moist mucus membranes.  Trachea midline, no masses. Cardiovascular: No clubbing, cyanosis, or edema. Respiratory: Normal respiratory effort, no increased work of breathing. Skin: No rashes, bruises or suspicious lesions. Neurologic: Grossly intact, no focal deficits, moving all 4 extremities. Psychiatric: Normal mood and affect.   Assessment & Plan:    1. Prostate cancer ([place]) High risk prostate cancer now status post IMRT with brachii therapy boost and ADT for 2 to 3 years  Concern in rising PSA given lurpon injections ?  Noncastrate levels versus early evolving castrate resistance/biochemical recurrence  Patient will receive Eligard in office today.  Plan to recheck PSA as well as testosterone in 3 months after Depo today to reassess.  He understands and is agreeable this plan.  He understands my concerns today.  Continue weightbearing exercises, calcium and vitamin D supplementation, etc. all reviewed today.  2. Urinary frequency Improving urinary symptoms, continue Flomax as needed.  3. Bilateral leg pain Unlikely related to eligard injections (patient question whether this was a possible side effect)  Discussed follow up with PCP and mentioned a sports therapy referral.    Follow up in 3 months for PSA and testosterone.  Return in about 3 months (around 09/13/2020) for Follow up w/ Labs prior .  [facility] [address], [place], [place] [address] [phone]  I, Selena Batten, am acting as a scribe for Dr. Hollice Espy.  I have reviewed the above documentation for accuracy and completeness, and I agree with the above.   Hollice Espy, MD  I spent 30 total minutes on the day of the encounter including pre-visit review of the medical record, face-to-face time with the patient, and post visit ordering of labs/imaging/tests.

## 2020-06-13 ENCOUNTER — Other Ambulatory Visit: Payer: Self-pay

## 2020-06-13 ENCOUNTER — Ambulatory Visit (INDEPENDENT_AMBULATORY_CARE_PROVIDER_SITE_OTHER): Payer: Medicare PPO | Admitting: Urology

## 2020-06-13 ENCOUNTER — Encounter: Payer: Self-pay | Admitting: Urology

## 2020-06-13 VITALS — BP 161/81 | HR 80 | Ht 66.0 in | Wt 206.4 lb

## 2020-06-13 DIAGNOSIS — C61 Malignant neoplasm of prostate: Secondary | ICD-10-CM

## 2020-06-13 MED ORDER — LEUPROLIDE ACETATE (6 MONTH) 45 MG ~~LOC~~ KIT
45.0000 mg | PACK | Freq: Once | SUBCUTANEOUS | Status: AC
  Administered 2020-06-13: 45 mg via SUBCUTANEOUS

## 2020-06-13 NOTE — Progress Notes (Signed)
Eligard SubQ Injection   Due to Prostate Cancer patient is present today for a Eligard Injection.  Medication: Eligard 6 month Dose: 45 mg  Location: left upper outer arm   Lot: [id number] Exp:12/ 2022  Patient tolerated well, no complications were noted.  Performed by: Gordy Clement, [place]  Per Dr. Erlene Quan patient is to continue therapy for 2-36yrs. Patient's next follow up was scheduled for 12/11/2020. This appointment was scheduled using wheel and given to patient today along with reminder continue on Vitamin D 800-1000iu and Calium 1000-1200mg  daily while on Androgen Deprivation Therapy.  PA approval dates: Must be approved every 6 months

## 2020-06-13 NOTE — Patient Instructions (Signed)

## 2020-06-22 ENCOUNTER — Other Ambulatory Visit: Payer: Self-pay

## 2020-06-22 ENCOUNTER — Ambulatory Visit
Admission: RE | Admit: 2020-06-22 | Discharge: 2020-06-22 | Disposition: A | Discharge status: Home / Self Care | Payer: Medicare PPO | Source: Ambulatory Visit | Attending: Radiation Oncology | Admitting: Radiation Oncology

## 2020-06-22 ENCOUNTER — Encounter: Payer: Self-pay | Admitting: Radiation Oncology

## 2020-06-22 VITALS — BP 152/94 | HR 80 | Temp 96.4°F | Resp 12 | Wt 204.0 lb

## 2020-06-22 DIAGNOSIS — Z923 Personal history of irradiation: Secondary | ICD-10-CM | POA: Insufficient documentation

## 2020-06-22 DIAGNOSIS — C61 Malignant neoplasm of prostate: Secondary | ICD-10-CM | POA: Insufficient documentation

## 2020-06-22 NOTE — Progress Notes (Signed)
Radiation Oncology Follow up Note  Name: Shawn Melton   Date:   06/22/2020 MRN:  [id number] DOB: [birth year]    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  REFERRING PROVIDER: [place] Hire, MD  HPI: Patient is a 64 year old male now out 10 months having completed both external beam radiation I-125 interstitial implant for boost as well is currently on androgen deprivation therapy for stage III Gleason 9 adenocarcinoma the prostate seen today in routine follow-up he is doing well.  He specifically denies any significant lower urinary tract symptoms diarrhea fatigue.  His PSA 2 weeks ago was 0.6 it was pointed to 8 months ago.  Dr. Erlene Melton is checking his testosterone level as well as keeping eyes on his PSA with slight increase.  COMPLICATIONS OF TREATMENT: none  FOLLOW UP COMPLIANCE: keeps appointments   PHYSICAL EXAM:  BP (!) 152/94 (BP Location: Left Arm, Patient Position: Sitting, Cuff Size: Normal)   Pulse 80   Temp (!) 96.4 F (35.8 C) (Tympanic)   Resp 12   Wt 204 lb (92.5 kg)   BMI 32.93 kg/m  Well-developed well-nourished patient in NAD. HEENT reveals PERLA, EOMI, discs not visualized.  Oral cavity is clear. No oral mucosal lesions are identified. Neck is clear without evidence of cervical or supraclavicular adenopathy. Lungs are clear to A&P. Cardiac examination is essentially unremarkable with regular rate and rhythm without murmur rub or thrill. Abdomen is benign with no organomegaly or masses noted. Motor sensory and DTR levels are equal and symmetric in the upper and lower extremities. Cranial nerves II through XII are grossly intact. Proprioception is intact. No peripheral adenopathy or edema is identified. No motor or sensory  levels are noted. Crude visual fields are within normal range.  RADIOLOGY RESULTS: No current imaging for review  PLAN: Present time again slight uptake in his PSA although with prostate brachytherapy this is not uncommon.  Under close surveillance by urology.  He will have his testosterone levels checked and continue on androgen deprivation therapy.  I have asked to see him back in 6 months for follow-up with a PSA at that time.  Patient knows to call with any concerns.  I would like to take this opportunity to thank you for allowing me to participate in the care of your patient.Shawn Filbert, MD

## 2020-08-02 ENCOUNTER — Encounter: Payer: Self-pay | Admitting: Dermatology

## 2020-08-02 ENCOUNTER — Ambulatory Visit (INDEPENDENT_AMBULATORY_CARE_PROVIDER_SITE_OTHER): Payer: Medicare PPO | Admitting: Dermatology

## 2020-08-02 ENCOUNTER — Other Ambulatory Visit: Payer: Self-pay | Admitting: Dermatology

## 2020-08-02 ENCOUNTER — Other Ambulatory Visit: Payer: Self-pay

## 2020-08-02 DIAGNOSIS — D1800 Hemangioma unspecified site: Secondary | ICD-10-CM

## 2020-08-02 DIAGNOSIS — Z85828 Personal history of other malignant neoplasm of skin: Secondary | ICD-10-CM | POA: Diagnosis not present

## 2020-08-02 DIAGNOSIS — D492 Neoplasm of unspecified behavior of bone, soft tissue, and skin: Secondary | ICD-10-CM

## 2020-08-02 DIAGNOSIS — Z86006 Personal history of melanoma in-situ: Secondary | ICD-10-CM | POA: Diagnosis not present

## 2020-08-02 DIAGNOSIS — L578 Other skin changes due to chronic exposure to nonionizing radiation: Secondary | ICD-10-CM

## 2020-08-02 DIAGNOSIS — L814 Other melanin hyperpigmentation: Secondary | ICD-10-CM

## 2020-08-02 DIAGNOSIS — C44319 Basal cell carcinoma of skin of other parts of face: Secondary | ICD-10-CM

## 2020-08-02 DIAGNOSIS — Z1283 Encounter for screening for malignant neoplasm of skin: Secondary | ICD-10-CM | POA: Diagnosis not present

## 2020-08-02 DIAGNOSIS — L918 Other hypertrophic disorders of the skin: Secondary | ICD-10-CM

## 2020-08-02 DIAGNOSIS — D229 Melanocytic nevi, unspecified: Secondary | ICD-10-CM

## 2020-08-02 DIAGNOSIS — L821 Other seborrheic keratosis: Secondary | ICD-10-CM

## 2020-08-02 DIAGNOSIS — C44310 Basal cell carcinoma of skin of unspecified parts of face: Secondary | ICD-10-CM

## 2020-08-02 NOTE — Progress Notes (Signed)
Follow-Up Visit   Subjective  Shawn Melton is a 64 y.o. male who presents for the following: Annual Exam (total body skin exam, 50m f/u hx of Melanoma IS L upper back post base of neck, hx of BCCs) and red bump (L sideburn/preauricular ~32m). The patient presents for Total-Body Skin Exam (TBSE) for skin cancer screening and mole check.  The following portions of the chart were reviewed this encounter and updated as appropriate:  Tobacco  Allergies  Meds  Problems  Med Hx  Surg Hx  Fam Hx     Review of Systems:  No other skin or systemic complaints except as noted in HPI or Assessment and Plan.  Objective  Well appearing patient in no apparent distress; mood and affect are within normal limits.  A full examination was performed including scalp, head, eyes, ears, nose, lips, neck, chest, axillae, abdomen, back, buttocks, bilateral upper extremities, bilateral lower extremities, hands, feet, fingers, toes, fingernails, and toenails. All findings within normal limits unless otherwise noted below.  Objective  L upper back post base of neck: Scar clear to visual exam and palpation, no lymphadenopathy  Objective  L cheek infra auricular area: 0.6cm crusted pap   Assessment & Plan    Lentigines - Scattered tan macules - Discussed due to sun exposure - Benign, observe - Call for any changes  Seborrheic Keratoses - Stuck-on, waxy, tan-brown papules and plaques  - Discussed benign etiology and prognosis. - Observe - Call for any changes  Melanocytic Nevi - Tan-brown and/or pink-flesh-colored symmetric macules and papules - Benign appearing on exam today - Observation - Call clinic for new or changing moles - Recommend daily use of broad spectrum spf 30+ sunscreen to sun-exposed areas.   Hemangiomas - Red papules - Discussed benign nature - Observe - Call for any changes  Actinic Damage - diffuse scaly erythematous macules with underlying dyspigmentation - Recommend  daily broad spectrum sunscreen SPF 30+ to sun-exposed areas, reapply every 2 hours as needed.  - Call for new or changing lesions.  Skin cancer screening performed today.  History of Basal Cell Carcinoma of the Skin - No evidence of recurrence today - Recommend regular full body skin exams - Recommend daily broad spectrum sunscreen SPF 30+ to sun-exposed areas, reapply every 2 hours as needed.  - Call if any new or changing lesions are noted between office visits  Acrochordons (Skin Tags) - Fleshy, skin-colored pedunculated papules - Benign appearing.  - Observe. - If desired, they can be removed with an in office procedure that is not covered by insurance. - Please call the clinic if you notice any new or changing lesions.  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 Observe for recurrence. Call clinic for new or changing lesions.  Recommend regular skin exams, daily broad-spectrum spf 30+ sunscreen use, and photoprotection.     Neoplasm of skin L cheek infra auricular area  Epidermal / dermal shaving  Lesion diameter (cm):  0.6 Informed consent: discussed and consent obtained   Timeout: patient name, date of birth, surgical site, and procedure verified   Procedure prep:  Patient was prepped and draped in usual sterile fashion Prep type:  Isopropyl alcohol Anesthesia: the lesion was anesthetized in a standard fashion   Anesthetic:  1% lidocaine w/ epinephrine 1-100,000 buffered w/ 8.4% NaHCO3 Instrument used: flexible razor blade   Hemostasis achieved with: pressure, aluminum chloride and electrodesiccation   Outcome: patient tolerated procedure well  Post-procedure details: sterile dressing applied and wound care instructions given   Dressing type: bandage and petrolatum    Destruction of lesion Complexity: extensive   Destruction method: electrodesiccation and curettage   Informed consent: discussed and consent obtained   Timeout:   patient name, date of birth, surgical site, and procedure verified Procedure prep:  Patient was prepped and draped in usual sterile fashion Prep type:  Isopropyl alcohol Anesthesia: the lesion was anesthetized in a standard fashion   Anesthetic:  1% lidocaine w/ epinephrine 1-100,000 buffered w/ 8.4% NaHCO3 Curettage performed in three different directions: Yes   Electrodesiccation performed over the curetted area: Yes   Lesion length (cm):  0.6 Lesion width (cm):  0.6 Margin per side (cm):  0.2 Final wound size (cm):  1 Hemostasis achieved with:  pressure, aluminum chloride and electrodesiccation Outcome: patient tolerated procedure well with no complications   Post-procedure details: sterile dressing applied and wound care instructions given   Dressing type: bandage and petrolatum    Specimen 1 - Surgical pathology Differential Diagnosis: D48.5 R/O SCC Check Margins: No 0.6cm crusted pap EDC today  Skin cancer screening  Return in about 6 months (around 01/30/2021) for TBSE, hx of Melanoma IS, BCC.  I, Shawn Melton, RMA, am acting as scribe for Sarina Ser, MD .  Documentation: I have reviewed the above documentation for accuracy and completeness, and I agree with the above.  Sarina Ser, MD

## 2020-08-02 NOTE — Patient Instructions (Signed)

## 2020-08-07 ENCOUNTER — Encounter: Payer: Self-pay | Admitting: Dermatology

## 2020-08-08 ENCOUNTER — Telehealth: Payer: Self-pay

## 2020-08-08 NOTE — Telephone Encounter (Signed)
Patient informed of pathology results 

## 2020-08-08 NOTE — Telephone Encounter (Signed)
-----   Message from Ralene Bathe, MD sent at 08/04/2020  6:21 PM EDT ----- Skin , (A) left cheek infra auricular area BASAL CELL CARCINOMA, NODULAR AND INFILTRATIVE PATTERNS  Cancer - [place] Already treated Recheck next visit

## 2020-08-29 ENCOUNTER — Other Ambulatory Visit: Payer: Self-pay | Admitting: Physician Assistant

## 2020-09-02 ENCOUNTER — Other Ambulatory Visit: Payer: Self-pay | Admitting: Dermatology

## 2020-09-14 ENCOUNTER — Other Ambulatory Visit: Payer: Medicare PPO

## 2020-09-14 ENCOUNTER — Other Ambulatory Visit: Payer: Self-pay

## 2020-09-14 DIAGNOSIS — C61 Malignant neoplasm of prostate: Secondary | ICD-10-CM

## 2020-09-15 LAB — PSA: Prostate Specific Ag, Serum: <0.1 ng/mL (ref 0.0–4.0)

## 2020-09-15 LAB — TESTOSTERONE: Testosterone: 17 ng/dL — ABNORMAL LOW (ref 264–916)

## 2020-09-20 NOTE — Progress Notes (Signed)
09/21/2020 9:28 AM   Shawn Melton 05-16-1956 [id number]  Referring provider: [place] Hire, MD [place],  [place] [address] Chief Complaint  Patient presents with   Prostate Cancer    discuss lab work    HPI: Shawn Melton is a 64 y.o. male who returns for a 3 month follow up of prostate cancer..   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  Last Lupron given 06/13/2020.  + At the time of last visit, he did have a slight bump in his PSA to 0.6 although suspicious that he was not at castrate levels at the time.  Most recent PSA <0.1 as of 09/14/2020, testosterone was 17 ng/dL.   He has been having hot flashes and night sweats on and off with the lupron injections. He has low energy and feels less motivated. He reports feeling depressed but is seeking professional help.   PSA trend: Component     Latest Ref Rng & Units 07/24/2018 01/25/2019 07/02/2019 10/05/2019  Prostate Specific Ag, Serum     0.0 - 4.0 ng/mL 4.8 (H) 7.1 (H) 22.7 (H) 0.2   Component     Latest Ref Rng & Units 06/02/2020 09/14/2020  Prostate Specific Ag, Serum     0.0 - 4.0 ng/mL 0.6 <0.1     PMH: Past Medical History:  Diagnosis Date   Basal cell carcinoma 09/21/2015   left infranasal on the sup philtrum   Basal cell carcinoma 02/26/2017   right of midline forehead   Chronic ankle pain    DJD (degenerative joint disease)    GERD (gastroesophageal reflux disease)    H/O endoscopy    History of stomach ulcers    Hx of colonoscopy    Hypertension    Melanoma ([place]) 10/10/2015   left upper back post base of neck, insitu/excision   Obesity    Reflux    Skin cancer    Sleep apnea    using upper/lower mouth piece since 2016 ( not using CPAP)     Surgical History: Past Surgical History:    Procedure Laterality Date   ABSCESS DRAINAGE     RADIOACTIVE SEED IMPLANT N/A 08/30/2019   Procedure: RADIOACTIVE SEED IMPLANT/BRACHYTHERAPY IMPLANT;  Surgeon: Hollice Espy, MD;  Location: [facility] ORS;  Service: Urology;  Laterality: N/A;   ROBOTIC PELVIC AND PARA-AORTIC LYMPH NODE DISSECTION Left 05/31/2019   Procedure: XI ROBOTIC PELVIC lymph NODE DISSECTION;  Surgeon: Hollice Espy, MD;  Location: [facility] ORS;  Service: Urology;  Laterality: Left;    Home Medications:  Allergies as of 09/21/2020      Reactions   Neosporin [bacitracin-polymyxin B] Itching, Other (See Comments)   redness      Medication List       Accurate as of September 21, 2020  9:28 AM. If you have any questions, ask your nurse or doctor.        acyclovir 400 MG tablet Commonly known as: ZOVIRAX TAKE 1 TABLET BY MOUTH 3 TIMES A DAY   aspirin 81 MG EC tablet Take by mouth.   Denavir 1 % cream Generic drug: penciclovir Apply 1 application topically daily as needed (cracks in skin from cold sore).   fluticasone 50 MCG/ACT nasal spray Commonly known as: FLONASE Place 2 sprays into both nostrils daily as needed for allergies or rhinitis.  ibuprofen 200 MG tablet Commonly known as: ADVIL Take 400 mg by mouth every 8 (eight) hours as needed for moderate pain.   losartan 50 MG tablet Commonly known as: COZAAR Take 50 mg by mouth at bedtime.   multivitamin with minerals Tabs tablet Take 1 tablet by mouth daily.   omeprazole 40 MG capsule Commonly known as: PRILOSEC Take 40 mg by mouth daily before breakfast.   tamsulosin 0.4 MG Caps capsule Commonly known as: FLOMAX TAKE 1 CAPSULE BY MOUTH EVERY DAY       Allergies:  Allergies  Allergen Reactions   Neosporin [Bacitracin-Polymyxin B] Itching and Other (See Comments)    redness    Family History: Family History  Problem Relation Age of Onset   Diabetes Father    Stroke Father    Hypertension Mother    Colon cancer Paternal Uncle     Prostate cancer Cousin     Social History:  reports that he has never smoked. He has never used smokeless tobacco. He reports that he does not drink alcohol and does not use drugs.   Physical Exam: BP 131/84    Pulse 87    Ht 5\' 6"  (1.676 m)    Wt 206 lb (93.4 kg)    BMI 33.25 kg/m   Constitutional:  Alert and oriented, No acute distress. HEENT: [place] AT, moist mucus membranes.  Trachea midline, no masses. Cardiovascular: No clubbing, cyanosis, or edema. Respiratory: Normal respiratory effort, no increased work of breathing. Skin: No rashes, bruises or suspicious lesions. Neurologic: Grossly intact, no focal deficits, moving all 4 extremities. Psychiatric: Normal mood and affect.  Laboratory Data  Lab Results  Component Value Date   TESTOSTERONE 17 (L) 09/14/2020    Assessment & Plan:    1. Prostate cancer Last Lupron given 06/13/2020. Recent labs are reassuring, now at castrate levels with undetectable PSA which is reassuring Suspect for whatever reason, his previous injection was not adequately absorbed or he did not reach castrate levels Continue to encourage weightbearing exercise and reviewed bone health recommendations We discussed the natural history of prostate cancer. Many questions were asked and answered. Continue Lupron injectins q6 months Monitor PSA q6 months.   Nurse visit in 3 months for his next ADT injection with PSA, MD visit in 9 months for PSA/ADT  [facility] [address] [place], [place] [address] [phone]  I, Selena Batten, am acting as a scribe for Dr. Hollice Espy.  I have reviewed the above documentation for accuracy and completeness, and I agree with the above.   Hollice Espy, MD

## 2020-09-21 ENCOUNTER — Encounter: Payer: Self-pay | Admitting: Urology

## 2020-09-21 ENCOUNTER — Other Ambulatory Visit: Payer: Self-pay

## 2020-09-21 ENCOUNTER — Ambulatory Visit: Payer: Medicare PPO | Admitting: Urology

## 2020-09-21 VITALS — BP 131/84 | HR 87 | Ht 66.0 in | Wt 206.0 lb

## 2020-09-21 DIAGNOSIS — C61 Malignant neoplasm of prostate: Secondary | ICD-10-CM

## 2020-12-10 ENCOUNTER — Encounter: Payer: Self-pay | Admitting: Urology

## 2020-12-11 ENCOUNTER — Ambulatory Visit: Payer: Self-pay

## 2020-12-13 ENCOUNTER — Other Ambulatory Visit: Payer: Medicare PPO

## 2020-12-15 ENCOUNTER — Ambulatory Visit (INDEPENDENT_AMBULATORY_CARE_PROVIDER_SITE_OTHER): Payer: Medicare PPO | Admitting: *Deleted

## 2020-12-15 ENCOUNTER — Other Ambulatory Visit: Payer: Self-pay

## 2020-12-15 ENCOUNTER — Other Ambulatory Visit: Payer: Self-pay | Admitting: *Deleted

## 2020-12-15 DIAGNOSIS — C61 Malignant neoplasm of prostate: Secondary | ICD-10-CM

## 2020-12-15 MED ORDER — LEUPROLIDE ACETATE (6 MONTH) 45 MG ~~LOC~~ KIT
45.0000 mg | PACK | Freq: Once | SUBCUTANEOUS | Status: AC
  Administered 2020-12-15: 45 mg via SUBCUTANEOUS

## 2020-12-15 NOTE — Progress Notes (Signed)
Eligard SubQ Injection   Due to Prostate Cancer patient is present today for a Eligard Injection.  Medication: Eligard 6 month Dose: 45 mg  Location: right arm Lot: [id number] Exp: 05/23  Patient tolerated well, no complications were noted  Performed by: Verlene Mayer, [place]  Per Dr. Erlene Quan patient is to continue therapy for 6 months . Patient's next follow up was scheduled for 06/19/21. This appointment was scheduled using wheel and given to patient today along with reminder continue on Vitamin D 800-1000iu and Calium 1000-1200mg  daily while on Androgen Deprivation Therapy.  PA approval dates:

## 2020-12-16 LAB — PSA: Prostate Specific Ag, Serum: <0.1 ng/mL (ref 0.0–4.0)

## 2020-12-18 ENCOUNTER — Ambulatory Visit: Payer: Self-pay

## 2020-12-20 ENCOUNTER — Telehealth: Payer: Self-pay | Admitting: *Deleted

## 2020-12-20 NOTE — Telephone Encounter (Addendum)
Patient informed, voiced understanding.   ----- Message from Hollice Espy, MD sent at 12/20/2020  9:59 AM EST ----- PSA remains undetectable

## 2020-12-28 ENCOUNTER — Ambulatory Visit
Admission: RE | Admit: 2020-12-28 | Discharge: 2020-12-28 | Disposition: A | Discharge status: Home / Self Care | Payer: Medicare PPO | Source: Ambulatory Visit | Attending: Radiation Oncology | Admitting: Radiation Oncology

## 2020-12-28 ENCOUNTER — Encounter: Payer: Self-pay | Admitting: Radiation Oncology

## 2020-12-28 DIAGNOSIS — Z923 Personal history of irradiation: Secondary | ICD-10-CM | POA: Insufficient documentation

## 2020-12-28 DIAGNOSIS — C775 Secondary and unspecified malignant neoplasm of intrapelvic lymph nodes: Secondary | ICD-10-CM | POA: Diagnosis not present

## 2020-12-28 DIAGNOSIS — C61 Malignant neoplasm of prostate: Secondary | ICD-10-CM | POA: Diagnosis not present

## 2020-12-28 NOTE — Progress Notes (Signed)
Radiation Oncology Follow up Note  Name: Shawn Melton   Date:   12/28/2020 MRN:  [id number] DOB: 1956-06-20    This 65 y.o. male presents to the clinic today for 44-month follow-up status post both external beam radiation therapy to his prostate and pelvic nodes as well as interstitial implant for boost and stage III (T3b N0 M0) Gleason 9 (4+5) adenocarcinoma presenting with a PSA of 7.1.  REFERRING PROVIDER: [place] Hire, MD  HPI: Patient is a 65 year old male now out 14 months having completed radiation therapy to both his prostate pelvic nodes as well as I-125 interstitial boost for stage III Gleason 9 adenocarcinoma seen today in routine follow-up he is doing well specifically denies any increased lower urinary tract symptoms diarrhea or fatigue.  His most recent PSA is less than 0.17 he is currently on androgen deprivation therapy..  COMPLICATIONS OF TREATMENT: none  FOLLOW UP COMPLIANCE: keeps appointments   PHYSICAL EXAM:  BP (!) (P) 154/90 (BP Location: Left Arm, Patient Position: Sitting)   Pulse (P) 78   Temp (!) (P) 95.1 F (35.1 C) (Tympanic)   Resp (P) 16   Wt (P) 212 lb 1.6 oz (96.2 kg)   BMI (P) 34.23 kg/m  Well-developed well-nourished patient in NAD. HEENT reveals PERLA, EOMI, discs not visualized.  Oral cavity is clear. No oral mucosal lesions are identified. Neck is clear without evidence of cervical or supraclavicular adenopathy. Lungs are clear to A&P. Cardiac examination is essentially unremarkable with regular rate and rhythm without murmur rub or thrill. Abdomen is benign with no organomegaly or masses noted. Motor sensory and DTR levels are equal and symmetric in the upper and lower extremities. Cranial nerves II through XII are grossly intact. Proprioception is intact. No peripheral adenopathy or edema is identified. No motor or sensory levels are noted. Crude visual fields are within normal range.  RADIOLOGY RESULTS: No current films to review  PLAN:  Present time patient is under excellent biochemical control of his prostate cancer although is on androgen deprivation therapy.  I have asked to see him back in 6 months for follow-up.  He continues close follow-up care with Dr. Erlene Quan.  Patient knows to call with any concerns.  I would like to take this opportunity to thank you for allowing me to participate in the care of your patient.Noreene Filbert, MD

## 2021-02-07 ENCOUNTER — Encounter: Payer: Self-pay | Admitting: Dermatology

## 2021-02-07 ENCOUNTER — Ambulatory Visit: Payer: Medicare PPO | Admitting: Dermatology

## 2021-02-07 ENCOUNTER — Other Ambulatory Visit: Payer: Self-pay

## 2021-02-07 DIAGNOSIS — L578 Other skin changes due to chronic exposure to nonionizing radiation: Secondary | ICD-10-CM

## 2021-02-07 DIAGNOSIS — Z1283 Encounter for screening for malignant neoplasm of skin: Secondary | ICD-10-CM | POA: Diagnosis not present

## 2021-02-07 DIAGNOSIS — L570 Actinic keratosis: Secondary | ICD-10-CM

## 2021-02-07 DIAGNOSIS — L821 Other seborrheic keratosis: Secondary | ICD-10-CM

## 2021-02-07 DIAGNOSIS — Z86006 Personal history of melanoma in-situ: Secondary | ICD-10-CM

## 2021-02-07 DIAGNOSIS — I781 Nevus, non-neoplastic: Secondary | ICD-10-CM | POA: Diagnosis not present

## 2021-02-07 DIAGNOSIS — Z85828 Personal history of other malignant neoplasm of skin: Secondary | ICD-10-CM

## 2021-02-07 DIAGNOSIS — D229 Melanocytic nevi, unspecified: Secondary | ICD-10-CM

## 2021-02-07 DIAGNOSIS — L814 Other melanin hyperpigmentation: Secondary | ICD-10-CM

## 2021-02-07 DIAGNOSIS — L918 Other hypertrophic disorders of the skin: Secondary | ICD-10-CM

## 2021-02-07 DIAGNOSIS — D1800 Hemangioma unspecified site: Secondary | ICD-10-CM

## 2021-02-07 NOTE — Progress Notes (Signed)
Follow-Up Visit   Subjective  Shawn Melton is a 65 y.o. male who presents for the following: total body skin exam ( Hx of Melanoma IS left upper back post base of neck, hx of BCCs) and check spots (L glabella red spot/Scalp crusty spot). The patient presents for Total-Body Skin Exam (TBSE) for skin cancer screening and mole check.  The following portions of the chart were reviewed this encounter and updated as appropriate:   Tobacco  Allergies  Meds  Problems  Med Hx  Surg Hx  Fam Hx     Review of Systems:  No other skin or systemic complaints except as noted in HPI or Assessment and Plan.  Objective  Well appearing patient in no apparent distress; mood and affect are within normal limits.  A full examination was performed including scalp, head, eyes, ears, nose, lips, neck, chest, axillae, abdomen, back, buttocks, bilateral upper extremities, bilateral lower extremities, hands, feet, fingers, toes, fingernails, and toenails. All findings within normal limits unless otherwise noted below.  Objective  left upper back post base of neck: Scar clear to visual exam and palpation, no lymphadenopathy.  Objective  multiple: Well healed scar with no evidence of recurrence.   Objective  above L medial brow: Pink blanching macule above L medial brow  Objective  Scalp x 6 (6): Pink scaly macules    Assessment & Plan    Lentigines - Scattered tan macules - Due to sun exposure - Benign-appering, observe - Recommend daily broad spectrum sunscreen SPF 30+ to sun-exposed areas, reapply every 2 hours as needed. - Call for any changes  Seborrheic Keratoses - Stuck-on, waxy, tan-brown papules and plaques  - Discussed benign etiology and prognosis. - Observe - Call for any changes  Melanocytic Nevi - Tan-brown and/or pink-flesh-colored symmetric macules and papules - Benign appearing on exam today - Observation - Call clinic for new or changing moles - Recommend daily use of  broad spectrum spf 30+ sunscreen to sun-exposed areas.   Hemangiomas - Red papules - Discussed benign nature - Observe - Call for any changes  Actinic Damage - Chronic, secondary to cumulative UV/sun exposure - diffuse scaly erythematous macules with underlying dyspigmentation - Recommend daily broad spectrum sunscreen SPF 30+ to sun-exposed areas, reapply every 2 hours as needed.  - Call for new or changing lesions.  Skin cancer screening performed today.  Acrochordons (Skin Tags) - Fleshy, skin-colored pedunculated papules - Benign appearing.  - Observe. - If desired, they can be removed with an in office procedure that is not covered by insurance. - Please call the clinic if you notice any new or changing lesions.  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  No lymphadenopathy.  Observe for recurrence. Call clinic for new or changing lesions.  Recommend regular skin exams, daily broad-spectrum spf 30+ sunscreen use, and photoprotection.     History of basal cell carcinoma (BCC) multiple Clear. Observe for recurrence. Call clinic for new or changing lesions.  Recommend regular skin exams, daily broad-spectrum spf 30+ sunscreen use, and photoprotection.     Telangiectasia above L medial brow Benign appearing, observe  AK (actinic keratosis) (6) Scalp x 6 Destruction of lesion - Scalp x 6 Complexity: simple   Destruction method: cryotherapy   Informed consent: discussed and consent obtained   Timeout:  patient name, date of birth, surgical site, and procedure verified Lesion destroyed using liquid nitrogen: Yes   Region frozen until ice  ball extended beyond lesion: Yes   Outcome: patient tolerated procedure well with no complications   Post-procedure details: wound care instructions given    Skin cancer screening  Return in about 1 year (around 02/07/2022) for TBSE, Hx of Melanoma IS, Hx of BCC, Hx of AKs.  I, [name], RMA,  am acting as scribe for Sarina Ser, MD .  Documentation: I have reviewed the above documentation for accuracy and completeness, and I agree with the above.  Sarina Ser, MD

## 2021-02-28 ENCOUNTER — Other Ambulatory Visit: Payer: Self-pay | Admitting: Physician Assistant

## 2021-05-08 ENCOUNTER — Encounter: Payer: Self-pay | Admitting: Urology

## 2021-06-14 ENCOUNTER — Ambulatory Visit: Payer: Self-pay

## 2021-06-15 ENCOUNTER — Other Ambulatory Visit: Payer: Medicare PPO

## 2021-06-15 ENCOUNTER — Other Ambulatory Visit: Payer: Self-pay

## 2021-06-15 DIAGNOSIS — R350 Frequency of micturition: Secondary | ICD-10-CM

## 2021-06-16 LAB — PSA: Prostate Specific Ag, Serum: <0.1 ng/mL (ref 0.0–4.0)

## 2021-06-19 ENCOUNTER — Other Ambulatory Visit: Payer: Self-pay

## 2021-06-19 ENCOUNTER — Encounter: Payer: Self-pay | Admitting: Urology

## 2021-06-19 ENCOUNTER — Ambulatory Visit: Payer: Medicare PPO | Admitting: Urology

## 2021-06-19 VITALS — BP 166/78 | HR 80 | Wt 206.0 lb

## 2021-06-19 DIAGNOSIS — C61 Malignant neoplasm of prostate: Secondary | ICD-10-CM

## 2021-06-19 DIAGNOSIS — R350 Frequency of micturition: Secondary | ICD-10-CM | POA: Diagnosis not present

## 2021-06-19 MED ORDER — LEUPROLIDE ACETATE (6 MONTH) 45 MG ~~LOC~~ KIT
45.0000 mg | PACK | Freq: Once | SUBCUTANEOUS | Status: AC
  Administered 2021-06-19: 45 mg via SUBCUTANEOUS

## 2021-06-21 NOTE — Progress Notes (Signed)
06/19/2021 5:27 PM   Shawn Melton May 06, 1956 [id number]  Referring provider: Baxter Hire, MD [place],  [place] [id number]  Chief Complaint  Patient presents with   Prostate Cancer    HPI: 50 y o 65 year-old male with personal history of prostate cancer who returns today for routine 34-month follow-up.  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  PSA remains undetectable as of 06/15/2021.  He reports today that he is voiding well.  He has no complaints.  Good stream.  No urgency or frequency.  He continues to take Flomax on a regular basis.  He is anxious to be done with ADT.  He occasionally has hot flashes.  He has been taking calcium and vitamin D.   PMH: Past Medical History:  Diagnosis Date   Basal cell carcinoma 09/21/2015   left infranasal on the sup philtrum   Basal cell carcinoma 02/26/2017   right of midline forehead   Basal cell carcinoma 08/02/2020   L cheek infra auricular    Chronic ankle pain    DJD (degenerative joint disease)    GERD (gastroesophageal reflux disease)    H/O endoscopy    History of stomach ulcers    Hx of colonoscopy    Hypertension    Melanoma ([place]) 10/10/2015   left upper back post base of neck, insitu/excision   Obesity    Reflux    Skin cancer    Sleep apnea    using upper/lower mouth piece since 2016 ( not using CPAP)     Surgical History: Past Surgical History:  Procedure Laterality Date   ABSCESS DRAINAGE     RADIOACTIVE SEED IMPLANT N/A 08/30/2019   Procedure: RADIOACTIVE SEED IMPLANT/BRACHYTHERAPY IMPLANT;  Surgeon: Shawn Espy, MD;  Location: [facility] ORS;  Service: Urology;  Laterality: N/A;   ROBOTIC PELVIC AND PARA-AORTIC LYMPH NODE DISSECTION Left 05/31/2019   Procedure: XI ROBOTIC PELVIC lymph NODE DISSECTION;   Surgeon: Shawn Espy, MD;  Location: [facility] ORS;  Service: Urology;  Laterality: Left;    Home Medications:  Allergies as of 06/19/2021       Reactions   Neosporin [bacitracin-polymyxin B] Itching, Other (See Comments)   redness        Medication List        Accurate as of June 19, 2021 11:59 PM. If you have any questions, ask your nurse or doctor.          acyclovir 400 MG tablet Commonly known as: ZOVIRAX TAKE 1 TABLET BY MOUTH 3 TIMES A DAY   aspirin 81 MG EC tablet Take by mouth.   Denavir 1 % cream Generic drug: penciclovir Apply 1 application topically daily as needed (cracks in skin from cold sore).   fluticasone 50 MCG/ACT nasal spray Commonly known as: FLONASE Place 2 sprays into both nostrils daily as needed for allergies or rhinitis.   ibuprofen 200 MG tablet Commonly known as: ADVIL Take 400 mg by mouth every 8 (eight) hours as needed for moderate pain.   losartan 50 MG tablet Commonly known as: COZAAR Take 50 mg by mouth at bedtime.   multivitamin with minerals Tabs tablet Take 1 tablet by mouth daily.   omeprazole 40 MG capsule Commonly known as: PRILOSEC Take 40 mg by mouth daily before breakfast.   sertraline 50 MG tablet Commonly  known as: ZOLOFT Take 1 tablet by mouth daily.   tamsulosin 0.4 MG Caps capsule Commonly known as: FLOMAX TAKE 1 CAPSULE BY MOUTH EVERY DAY        Allergies:  Allergies  Allergen Reactions   Neosporin [Bacitracin-Polymyxin B] Itching and Other (See Comments)    redness    Family History: Family History  Problem Relation Age of Onset   Diabetes Father    Stroke Father    Hypertension Mother    Colon cancer Paternal Uncle    Prostate cancer Cousin     Social History:  reports that he has never smoked. He has never used smokeless tobacco. He reports that he does not drink alcohol and does not use drugs.   Physical Exam: BP (!) 166/78   Pulse 80   Wt 206 lb (93.4 kg)   BMI 33.25 kg/m    Constitutional:  Alert and oriented, No acute distress. HEENT: [place] AT, moist mucus membranes.  Trachea midline, no masses. Cardiovascular: No clubbing, cyanosis, or edema. Respiratory: Normal respiratory effort, no increased work of breathing. Skin: No rashes, bruises or suspicious lesions. Neurologic: Grossly intact, no focal deficits, moving all 4 extremities. Psychiatric: Normal mood and affect.   Assessment & Plan:    \1. Malignant neoplasm of prostate ([place]) High risk prostate cancer status post IMRT on ADT  Leuprolide Depo given today, reviewed precautions  Shared decision making today, today will be his last injection.  We discussed the concept of PSA nadir as his PSA begins to rise after the medications to wear off.  We will tentatively plan for PSA only in 6 months and then see me in 1 year with PSA prior.  He is agreeable this plan.  He will continue to take Val calcium and vitamin D, weightbearing exercises, etc. - PSA - leuprolide (6 Month) (ELIGARD) injection 45 mg  2. Urinary frequency Continue Flomax, symptoms well controlled on this medication   PSA only in 6 months, PSA with MD in 1 year  Shawn Espy, MD  [facility] [address], [place], [place] [address] [phone]

## 2021-08-19 ENCOUNTER — Other Ambulatory Visit: Payer: Self-pay | Admitting: Physician Assistant

## 2021-11-28 ENCOUNTER — Other Ambulatory Visit: Payer: Self-pay | Admitting: *Deleted

## 2021-11-28 DIAGNOSIS — C61 Malignant neoplasm of prostate: Secondary | ICD-10-CM

## 2021-12-13 ENCOUNTER — Other Ambulatory Visit: Payer: Self-pay

## 2021-12-13 ENCOUNTER — Other Ambulatory Visit: Payer: Medicare PPO

## 2021-12-13 DIAGNOSIS — C61 Malignant neoplasm of prostate: Secondary | ICD-10-CM

## 2021-12-14 LAB — PSA: Prostate Specific Ag, Serum: <0.1 ng/mL (ref 0.0–4.0)

## 2021-12-20 ENCOUNTER — Other Ambulatory Visit: Payer: Self-pay

## 2021-12-25 ENCOUNTER — Other Ambulatory Visit: Payer: Self-pay | Admitting: *Deleted

## 2021-12-28 ENCOUNTER — Other Ambulatory Visit: Payer: Self-pay

## 2021-12-28 ENCOUNTER — Ambulatory Visit
Admission: RE | Admit: 2021-12-28 | Discharge: 2021-12-28 | Disposition: A | Discharge status: Home / Self Care | Payer: Medicare PPO | Source: Ambulatory Visit | Attending: Radiation Oncology | Admitting: Radiation Oncology

## 2021-12-28 ENCOUNTER — Encounter: Payer: Self-pay | Admitting: Radiation Oncology

## 2021-12-28 DIAGNOSIS — Z923 Personal history of irradiation: Secondary | ICD-10-CM | POA: Insufficient documentation

## 2021-12-28 DIAGNOSIS — C61 Malignant neoplasm of prostate: Secondary | ICD-10-CM | POA: Diagnosis present

## 2021-12-28 NOTE — Progress Notes (Signed)
Radiation Oncology Follow up Note  Name: Shawn Melton   Date:   12/28/2021 MRN:  [id number] DOB: [birth year]  Radiation Oncology TeleHEALTH VISIT PROGRESS NOTE  I connected with          Shawn Melton     by telephone-Webex and verified that I am speaking with the correct person using two identifiers.  I discussed the limitations, risks, security and privacy concerns of performing an evaluation and management service by telemedicine and the availability of in-person appointments. I also discussed with the patient that there may be a patient responsible charge related to this service. The patient expressed understanding and agreed to proceed.    Other persons participating in the visit and their role in the encounter:    Patient's location: Home   Provider's location: work  This 66 y.o. 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  REFERRING PROVIDER: Baxter Hire, MD  HPI: Patient is a 66 year old male now out close to 2-1/2 years having completed both external beam radiation as well as I-125 interstitial implant for boost for Gleason 9 adenocarcinoma of the prostate stage III.Marland Kitchen  He was contacted by phone today he is doing well specifically denies any increased lower urinary tract symptoms diarrhea or fatigue.  His PSA remains undetectable at less than 0.1.  He has been discontinued from [place].  COMPLICATIONS OF TREATMENT: none  FOLLOW UP COMPLIANCE: keeps appointments   PHYSICAL EXAM:  There were no vitals taken for this visit. No physical exam was performed  RADIOLOGY RESULTS: No current films for review  PLAN: Present time patient is close to 2 and half years out from radiation therapy both external beam and implant for high-grade Gleason 9 adenocarcinoma  the prostate.  He remains biochemical free.  I am going to turn follow-up care over to urology.  He continues close follow-up care and PSA tests by their department.  Be happy to reevaluate the patient in time should further consultation be indicated.  I would like to take this opportunity to thank you for allowing me to participate in the care of your patient.Noreene Filbert, MD

## 2022-02-13 ENCOUNTER — Ambulatory Visit: Payer: Medicare PPO | Admitting: Dermatology

## 2022-02-13 ENCOUNTER — Other Ambulatory Visit: Payer: Self-pay

## 2022-02-13 DIAGNOSIS — L578 Other skin changes due to chronic exposure to nonionizing radiation: Secondary | ICD-10-CM | POA: Diagnosis not present

## 2022-02-13 DIAGNOSIS — Z85828 Personal history of other malignant neoplasm of skin: Secondary | ICD-10-CM

## 2022-02-13 DIAGNOSIS — L570 Actinic keratosis: Secondary | ICD-10-CM | POA: Diagnosis not present

## 2022-02-13 DIAGNOSIS — L820 Inflamed seborrheic keratosis: Secondary | ICD-10-CM

## 2022-02-13 DIAGNOSIS — Z86006 Personal history of melanoma in-situ: Secondary | ICD-10-CM | POA: Diagnosis not present

## 2022-02-13 DIAGNOSIS — L918 Other hypertrophic disorders of the skin: Secondary | ICD-10-CM

## 2022-02-13 DIAGNOSIS — D229 Melanocytic nevi, unspecified: Secondary | ICD-10-CM

## 2022-02-13 DIAGNOSIS — Z1283 Encounter for screening for malignant neoplasm of skin: Secondary | ICD-10-CM | POA: Diagnosis not present

## 2022-02-13 DIAGNOSIS — B009 Herpesviral infection, unspecified: Secondary | ICD-10-CM | POA: Diagnosis not present

## 2022-02-13 DIAGNOSIS — D1800 Hemangioma unspecified site: Secondary | ICD-10-CM

## 2022-02-13 DIAGNOSIS — L814 Other melanin hyperpigmentation: Secondary | ICD-10-CM

## 2022-02-13 DIAGNOSIS — L821 Other seborrheic keratosis: Secondary | ICD-10-CM

## 2022-02-13 MED ORDER — PENCICLOVIR 1 % EX CREA: TOPICAL_CREAM | CUTANEOUS | 11 refills | Status: DC

## 2022-02-13 MED ORDER — ACYCLOVIR 400 MG PO TABS
400.0000 mg | ORAL_TABLET | Freq: Three times a day (TID) | ORAL | 11 refills | Status: AC
Start: 2022-02-13 — End: 2022-02-23

## 2022-02-13 NOTE — Patient Instructions (Addendum)

## 2022-02-13 NOTE — Progress Notes (Signed)
? ?Follow-Up Visit ?  ?Subjective  ?Shawn Melton is a 66 y.o. male who presents for the following: Annual Exam (Mole check ). Yearly mole check hx of skin cancer. Pt c/o irritated skin tags on his neck and arms.  ?The patient presents for Total-Body Skin Exam (TBSE) for skin cancer screening and mole check.  The patient has spots, moles and lesions to be evaluated, some may be new or changing and the patient has concerns that these could be cancer.  ? ?The following portions of the chart were reviewed this encounter and updated as appropriate:  ? Tobacco  Allergies  Meds  Problems  Med Hx  Surg Hx  Fam Hx   ?  ?Review of Systems:  No other skin or systemic complaints except as noted in HPI or Assessment and Plan. ? ?Objective  ?Well appearing patient in no apparent distress; mood and affect are within normal limits. ? ?A full examination was performed including scalp, head, eyes, ears, nose, lips, neck, chest, axillae, abdomen, back, buttocks, bilateral upper extremities, bilateral lower extremities, hands, feet, fingers, toes, fingernails, and toenails. All findings within normal limits unless otherwise noted below. ? ?left crown scalp x  5 (5) ?Erythematous thin papules/macules with gritty scale.  ? ?neck x 2 (2) ?Stuck-on, waxy, tan-brown papule or plaque --Discussed benign etiology and prognosis.  ? ?Head - Anterior (Face) ?Mainly clear  ? ? ?Assessment & Plan  ?AK (actinic keratosis) (5) ?left crown scalp x  5 ? ?Actinic keratoses are precancerous spots that appear secondary to cumulative UV radiation exposure/sun exposure over time. They are chronic with expected duration over 1 year. A portion of actinic keratoses will progress to squamous cell carcinoma of the skin. It is not possible to reliably predict which spots will progress to skin cancer and so treatment is recommended to prevent development of skin cancer. ? ?Recommend daily broad spectrum sunscreen SPF 30+ to sun-exposed areas, reapply every 2  hours as needed.  ?Recommend staying in the shade or wearing long sleeves, sun glasses (UVA+UVB protection) and wide brim hats (4-inch brim around the entire circumference of the hat). ?Call for new or changing lesions.  ? ?Destruction of lesion - left crown scalp x  5 ?Complexity: simple   ?Destruction method: cryotherapy   ?Informed consent: discussed and consent obtained   ?Timeout:  patient name, date of birth, surgical site, and procedure verified ?Lesion destroyed using liquid nitrogen: Yes   ?Region frozen until ice ball extended beyond lesion: Yes   ?Outcome: patient tolerated procedure well with no complications   ?Post-procedure details: wound care instructions given   ? ?Inflamed seborrheic keratosis (2) ?neck x 2 ? ?Reassured benign age-related growth.  Recommend observation.  Discussed cryotherapy if spot(s) become irritated or inflamed.  ? ?Destruction of lesion - neck x 2 ?Complexity: simple   ?Destruction method: cryotherapy   ?Informed consent: discussed and consent obtained   ?Timeout:  patient name, date of birth, surgical site, and procedure verified ?Lesion destroyed using liquid nitrogen: Yes   ?Region frozen until ice ball extended beyond lesion: Yes   ?Outcome: patient tolerated procedure well with no complications   ?Post-procedure details: wound care instructions given   ? ?Herpes simplex of the lips ?Head - Anterior (Face) ? ?Herpes Simplex Virus = Cold Sores = Fever Blisters is a chronic recurring blistering; scabbing sore-producing viral infection that is recurrent usually in the same area triggered by stress, sun/UV exposure and trauma.  It is infectious and can  be spread from person to person by direct contact.  It is not curable, but is treatable with topical and oral medication.  ? ?Cont Acyclovir 400 mg as directed ?Cont Denavir cream as directed  ? ?Related Medications ?acyclovir (ZOVIRAX) 400 MG tablet ?Take 1 tablet (400 mg total) by mouth 3 (three) times daily for 10  days. ? ?penciclovir (DENAVIR) 1 % cream ?Apply to corners of mouth 5 times a day prn flares ? ?Skin cancer screening ? ?Lentigines ?- Scattered tan macules ?- Due to sun exposure ?- Benign-appearing, observe ?- Recommend daily broad spectrum sunscreen SPF 30+ to sun-exposed areas, reapply every 2 hours as needed. ?- Call for any changes ? ?Seborrheic Keratoses ?- Stuck-on, waxy, tan-brown papules and/or plaques  ?- Benign-appearing ?- Discussed benign etiology and prognosis. ?- Observe ?- Call for any changes ? ?Melanocytic Nevi ?- Tan-brown and/or pink-flesh-colored symmetric macules and papules ?- Benign appearing on exam today ?- Observation ?- Call clinic for new or changing moles ?- Recommend daily use of broad spectrum spf 30+ sunscreen to sun-exposed areas.  ? ?Hemangiomas ?- Red papules ?- Discussed benign nature ?- Observe ?- Call for any changes ? ?Actinic Damage ?- Chronic condition, secondary to cumulative UV/sun exposure ?- diffuse scaly erythematous macules with underlying dyspigmentation ?- Recommend daily broad spectrum sunscreen SPF 30+ to sun-exposed areas, reapply every 2 hours as needed.  ?- Staying in the shade or wearing long sleeves, sun glasses (UVA+UVB protection) and wide brim hats (4-inch brim around the entire circumference of the hat) are also recommended for sun protection.  ?- Call for new or changing lesions. ? ?Acrochordons (Skin Tags) - Removal desired by patient ?- Fleshy, skin-colored pedunculated papules ?- Benign appearing.  ?- Patient desires removal. Reviewed that this is not covered by insurance and they will be charged a cosmetic fee for removal. Patient signed non-covered consent.  ?- Prior to procedure, discussed risks of blister formation, small wound, skin dyspigmentation, or rare scar following cryotherapy.  ?PROCEDURE ?- Cryotherapy was performed to affected areas. The procedure was tolerated well. Wound care was reviewed with the patient. They were advised to call  with any concerns. Total number of treated acrochordons 18  ? ?History of Basal Cell Carcinoma of the Skin ?Multiple see history  ?- No evidence of recurrence today ?- Recommend regular full body skin exams ?- Recommend daily broad spectrum sunscreen SPF 30+ to sun-exposed areas, reapply every 2 hours as needed.  ?- Call if any new or changing lesions are noted between office visits  ? ?History of Melanoma in Situ ?Left upper back post base of neck 2016 ?- No evidence of recurrence today ?- Recommend regular full body skin exams ?- Recommend daily broad spectrum sunscreen SPF 30+ to sun-exposed areas, reapply every 2 hours as needed.  ?- Call if any new or changing lesions are noted between office visits  ? ?Skin cancer screening performed today.  ? ?Return in about 1 year (around 02/14/2023) for TBSE in 12 months, return for skin tag removal prn . ? ?I, Marye Round, CMA, am acting as scribe for Sarina Ser, MD .  ?Documentation: I have reviewed the above documentation for accuracy and completeness, and I agree with the above. ? ?Sarina Ser, MD ? ?

## 2022-02-18 ENCOUNTER — Telehealth: Payer: Self-pay

## 2022-02-18 NOTE — Telephone Encounter (Signed)
Patients Penciclovir 1% cream was denied due to patient not having tried and failed either Valacyclovir or Famciclovir. Please advise.  ?

## 2022-02-19 ENCOUNTER — Encounter: Payer: Self-pay | Admitting: Dermatology

## 2022-02-19 NOTE — Telephone Encounter (Signed)
Patient advised and does not want anything sent in at this time. aw ?

## 2022-03-20 ENCOUNTER — Other Ambulatory Visit: Payer: Self-pay | Admitting: Physician Assistant

## 2022-03-27 ENCOUNTER — Ambulatory Visit: Payer: Medicare PPO | Admitting: Dermatology

## 2022-03-27 DIAGNOSIS — L820 Inflamed seborrheic keratosis: Secondary | ICD-10-CM

## 2022-03-27 DIAGNOSIS — L918 Other hypertrophic disorders of the skin: Secondary | ICD-10-CM

## 2022-03-27 NOTE — Patient Instructions (Addendum)
Cryotherapy Aftercare ? ?Wash gently with soap and water everyday.   ?Apply Vaseline and Band-Aid daily until healed.  ? ? ? ? You Need Anything After Your Visit ? ?If you have any questions or concerns for your doctor, please call our main line at [phone] and press option 4 to reach your doctor's medical assistant. If no one answers, please leave a voicemail as directed and we will return your call as soon as possible. Messages left after 4 pm will be answered the following business day.  ? ?You may also send [place] a message via MyChart. We typically respond to MyChart messages within 1-2 business days. ? ?For prescription refills, please ask your pharmacy to contact our office. Our fax number is [phone]. ? ?If you have an urgent issue when the clinic is closed that cannot wait until the next business day, you can page your doctor at the number below.   ? ?Please note that while we do our best to be available for urgent issues outside of office hours, we are not available 24/7.  ? ?If you have an urgent issue and are unable to reach [place], you may choose to seek medical care at your doctor's office, retail clinic, urgent care center, or emergency room. ? ?If you have a medical emergency, please immediately call 911 or go to the emergency department. ? ?Pager Numbers ? ?- Dr. Nehemiah Massed: [phone] ? ?- Dr. Laurence Ferrari: [phone] ? ?- Dr. Nicole Kindred: [phone] ? ?In the event of inclement weather, please call our main line at [phone] for an update on the status of any delays or closures. ? ?Dermatology Medication Tips: ?Please keep the boxes that topical medications come in in order to help keep track of the instructions about where and how to use these. Pharmacies typically print the medication instructions only on the boxes and not directly on the medication tubes.  ? ?If your medication is too expensive, please contact our office at [phone] option 4 or send [place] a message through [place].  ? ?We are  unable to tell what your co-pay for medications will be in advance as this is different depending on your insurance coverage. However, we may be able to find a substitute medication at lower cost or fill out paperwork to get insurance to cover a needed medication.  ? ?If a prior authorization is required to get your medication covered by your insurance company, please allow [place] 1-2 business days to complete this process. ? ?Drug prices often vary depending on where the prescription is filled and some pharmacies may offer cheaper prices. ? ?The website www.goodrx.com contains coupons for medications through different pharmacies. The prices here do not account for what the cost may be with help from insurance (it may be cheaper with your insurance), but the website can give you the price if you did not use any insurance.  ?- You can print the associated coupon and take it with your prescription to the pharmacy.  ?- You may also stop by our office during regular business hours and pick up a GoodRx coupon card.  ?- If you need your prescription sent electronically to a different pharmacy, notify our office through [facility] or by phone at [phone] option 4. ? ? ? ? ?Si Usted Necesita Algo Despu?s de Su Visita ? ?Tambi?n puede enviarnos un mensaje a trav?s de MyChart. Por lo general respondemos a los mensajes de MyChart en el transcurso de 1 a 2 d?as h?biles. ? ?Para renovar recetas, por  favor pida a su farmacia que se ponga en contacto con nuestra oficina. Nuestro n?mero de fax es el [phone]. ? ?Si tiene un asunto urgente cuando la cl?nica est? cerrada y que no puede esperar hasta el siguiente d?a h?bil, puede llamar/localizar a su doctor(a) al n?mero que aparece a continuaci?n.  ? ?Por favor, tenga en cuenta que aunque hacemos todo lo posible para estar disponibles para asuntos urgentes fuera del horario de oficina, no estamos disponibles las 24 horas del d?a, los 7 d?as de la semana.  ? ?Si tiene un  problema urgente y no puede comunicarse con nosotros, puede optar por buscar atenci?n m?dica  en el consultorio de su doctor(a), en una cl?nica privada, en un centro de atenci?n urgente o en una sala de emergencias. ? ?Si tiene Engineer, maintenance (IT) m?dica, por favor llame inmediatamente al 911 o vaya a la sala de emergencias. ? ?N?meros de b?per ? ?- Dr. Nehemiah Massed: [phone] ? ?- Dra. Moye: [phone] ? ?- Dra. Nicole Kindred: [phone] ? ?En caso de inclemencias del tiempo, por favor llame a nuestra l?nea principal al [phone] para una actualizaci?n sobre el estado de cualquier retraso o cierre. ? ?Consejos para la medicaci?n en dermatolog?a: ?Por favor, guarde las cajas en las que vienen los medicamentos de uso t?pico para ayudarle a seguir las instrucciones sobre d?nde y c?mo usarlos. Las farmacias generalmente imprimen las instrucciones del medicamento s?lo en las cajas y no directamente en los tubos del [place].  ? ?Si su medicamento es muy caro, por favor, p?ngase en contacto con Zigmund Daniel llamando al [phone] y presione la opci?n 4 o env?enos un mensaje a trav?s de MyChart.  ? ?No podemos decirle cu?l ser? su copago por los medicamentos por adelantado ya que esto es diferente dependiendo de la cobertura de su seguro. Sin embargo, es posible que podamos encontrar un medicamento sustituto a Electrical engineer un formulario para que el seguro cubra el medicamento que se considera necesario.  ? ?Si se requiere Ardelia Mems autorizaci?n previa para que su compa??a de seguros [place] su medicamento, por favor perm?tanos de 1 a 2 d?as h?biles para completar este proceso. ? ?Los precios de los medicamentos var?an con frecuencia dependiendo del Environmental consultant de d?nde se surte la receta y alguna farmacias pueden ofrecer precios m?s baratos. ? ?El sitio web www.goodrx.com tiene cupones para medicamentos de Airline pilot. Los precios aqu? no tienen en cuenta lo que podr?a costar con la ayuda del seguro (puede ser m?s  barato con su seguro), pero el sitio web puede darle el precio si no utiliz? ning?n seguro.  ?- Puede imprimir el cup?n correspondiente y llevarlo con su receta a la farmacia.  ?- Tambi?n puede pasar por nuestra oficina durante el horario de atenci?n regular y recoger una tarjeta de cupones de GoodRx.  ?- Si necesita que su receta se env?e electr?nicamente a Chiropodist, informe a nuestra oficina a trav?s de MyChart de  o por tel?fono llamando al [phone] y presione la opci?n 4.  ?

## 2022-03-27 NOTE — Progress Notes (Signed)
? ?  Follow-Up Visit ?  ?Subjective  ?Shawn Melton is a 66 y.o. male who presents for the following: Other (Skin tags of bilateral axilla - some were treated with LN2 at last appointment and they did well. He would like the larger ones removed today). ? ?The following portions of the chart were reviewed this encounter and updated as appropriate:  ? Tobacco  Allergies  Meds  Problems  Med Hx  Surg Hx  Fam Hx   ?  ?Review of Systems:  No other skin or systemic complaints except as noted in HPI or Assessment and Plan. ? ?Objective  ?Well appearing patient in no apparent distress; mood and affect are within normal limits. ? ?A focused examination was performed including bilateral axilla. Relevant physical exam findings are noted in the Assessment and Plan. ? ?Right Axilla x 3, left axilla x 3 (6) ?Fleshy, skin-colored pedunculated papules.   ? ?Left Axilla ?Erythematous stuck-on, waxy papule or plaque ? ? ?Assessment & Plan  ?Skin tag (6) ?Right Axilla x 3, left axilla x 3 ? ?Epidermal / dermal shaving - Right Axilla x 3, left axilla x 3 ? ?Informed consent: discussed and consent obtained   ?Patient was prepped and draped in usual sterile fashion: area prepped with alcohol. ?Anesthesia: the lesion was anesthetized in a standard fashion   ?Anesthetic:  1% lidocaine w/ epinephrine 1-100,000 buffered w/ 8.4% NaHCO3 ?Instrument used: scissors   ?Hemostasis achieved with: pressure, aluminum chloride and electrodesiccation   ?Outcome: patient tolerated procedure well   ?Post-procedure details: wound care instructions given   ?Post-procedure details comment:  Ointment and bandaid applied ? ?Inflamed seborrheic keratosis ?Left Axilla ? ?Destruction of lesion - Left Axilla ?Complexity: simple   ?Destruction method: cryotherapy   ?Informed consent: discussed and consent obtained   ?Timeout:  patient name, date of birth, surgical site, and procedure verified ?Lesion destroyed using liquid nitrogen: Yes   ?Region frozen until  ice ball extended beyond lesion: Yes   ?Outcome: patient tolerated procedure well with no complications   ?Post-procedure details: wound care instructions given   ? ? ?Return for Follow up as scheduled, TBSE. ? ?I, Ashok Cordia, CMA, am acting as scribe for Sarina Ser, MD . ?Documentation: I have reviewed the above documentation for accuracy and completeness, and I agree with the above. ? ?Sarina Ser, MD ? ?

## 2022-04-07 ENCOUNTER — Encounter: Payer: Self-pay | Admitting: Dermatology

## 2022-06-06 ENCOUNTER — Other Ambulatory Visit: Payer: Self-pay

## 2022-06-06 DIAGNOSIS — C61 Malignant neoplasm of prostate: Secondary | ICD-10-CM

## 2022-06-13 ENCOUNTER — Other Ambulatory Visit: Payer: Medicare PPO

## 2022-06-13 DIAGNOSIS — C61 Malignant neoplasm of prostate: Secondary | ICD-10-CM

## 2022-06-14 LAB — PSA: Prostate Specific Ag, Serum: <0.1 ng/mL (ref 0.0–4.0)

## 2022-06-19 ENCOUNTER — Ambulatory Visit: Payer: Medicare PPO | Admitting: Urology

## 2022-06-19 ENCOUNTER — Encounter: Payer: Self-pay | Admitting: Urology

## 2022-06-19 VITALS — BP 143/79 | HR 88 | Ht 66.0 in | Wt 212.0 lb

## 2022-06-19 DIAGNOSIS — C61 Malignant neoplasm of prostate: Secondary | ICD-10-CM

## 2022-06-19 DIAGNOSIS — Z8546 Personal history of malignant neoplasm of prostate: Secondary | ICD-10-CM | POA: Diagnosis not present

## 2022-06-19 DIAGNOSIS — Z8639 Personal history of other endocrine, nutritional and metabolic disease: Secondary | ICD-10-CM | POA: Diagnosis not present

## 2022-06-19 DIAGNOSIS — R350 Frequency of micturition: Secondary | ICD-10-CM | POA: Diagnosis not present

## 2022-06-19 MED ORDER — TAMSULOSIN HCL 0.4 MG PO CAPS: 0.4000 mg | ORAL_CAPSULE | Freq: Every day | ORAL | 3 refills | Status: DC

## 2022-06-19 NOTE — Progress Notes (Signed)
06/19/22 12:15 PM   Tarquin Welcher Bernardini [birth year] [id number]  Referring provider:  Baxter Hire, MD [place],  [place] [address] Chief Complaint  Patient presents with   Prostate Cancer    1 year follow up    HPI: Shawn Melton is a 66 y.o.male with personal history of prostate cancer and low testosterone who returns today for routine annual follow-up.   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 He received his last DEPO on 06/19/2021.   His PSA remain undetectable on 06/13/2022.   He reports that he still has hot flashes but they are less intense. He still has some urinary frequency that improved on Flomax. He has no new urinary symptoms.  He tried stopping Flomax but noticed a slight decrease in his urinary stream and resume this medication.   PMH: Past Medical History:  Diagnosis Date   Basal cell carcinoma 09/21/2015   left infranasal on the sup philtrum   Basal cell carcinoma 02/26/2017   right of midline forehead   Basal cell carcinoma 08/02/2020   L cheek infra auricular    Chronic ankle pain    DJD (degenerative joint disease)    GERD (gastroesophageal reflux disease)    H/O endoscopy    History of stomach ulcers    Hx of colonoscopy    Hypertension    Melanoma ([place]) 10/10/2015   left upper back post base of neck, insitu/excision   Obesity    Reflux    Skin cancer    Sleep apnea    using upper/lower mouth piece since 2016 ( not using CPAP)     Surgical History: Past Surgical History:  Procedure Laterality Date   ABSCESS DRAINAGE     RADIOACTIVE SEED IMPLANT N/A 08/30/2019   Procedure: RADIOACTIVE SEED IMPLANT/BRACHYTHERAPY IMPLANT;  Surgeon: Hollice Espy, MD;  Location: [facility] ORS;  Service: Urology;  Laterality: N/A;   ROBOTIC PELVIC AND PARA-AORTIC LYMPH NODE  DISSECTION Left 05/31/2019   Procedure: XI ROBOTIC PELVIC lymph NODE DISSECTION;  Surgeon: Hollice Espy, MD;  Location: [facility] ORS;  Service: Urology;  Laterality: Left;    Home Medications:  Allergies as of 06/19/2022       Reactions   Neosporin [bacitracin-polymyxin B] Itching, Other (See Comments)   redness        Medication List        Accurate as of June 19, 2022 12:15 PM. If you have any questions, ask your nurse or doctor.          STOP taking these medications    penciclovir 1 % cream Commonly known as: Denavir Stopped by: Hollice Espy, MD       TAKE these medications    acyclovir 400 MG tablet Commonly known as: ZOVIRAX TAKE 1 TABLET BY MOUTH 3 TIMES A DAY   aspirin EC 81 MG tablet Take by mouth.   fluticasone 50 MCG/ACT nasal spray Commonly known as: FLONASE Place 2 sprays into both nostrils daily as needed for allergies or rhinitis.   ibuprofen 200 MG tablet Commonly known as: ADVIL Take 400 mg by mouth every 8 (eight) hours as needed for moderate pain.   losartan 50 MG tablet Commonly known as: COZAAR Take 50 mg by mouth at bedtime.   multivitamin with minerals Tabs tablet Take 1 tablet by mouth daily.  omeprazole 40 MG capsule Commonly known as: PRILOSEC Take 40 mg by mouth daily before breakfast.   tamsulosin 0.4 MG Caps capsule Commonly known as: FLOMAX Take 1 capsule (0.4 mg total) by mouth daily.        Allergies:  Allergies  Allergen Reactions   Neosporin [Bacitracin-Polymyxin B] Itching and Other (See Comments)    redness    Family History: Family History  Problem Relation Age of Onset   Diabetes Father    Stroke Father    Hypertension Mother    Colon cancer Paternal Uncle    Prostate cancer Cousin     Social History:  reports that he has never smoked. He has never used smokeless tobacco. He reports that he does not drink alcohol and does not use drugs.   Physical Exam: BP (!) 143/79   Pulse 88   Ht '5\' 6"'$   (1.676 m)   Wt 212 lb (96.2 kg)   BMI 34.22 kg/m   Constitutional:  Alert and oriented, No acute distress. HEENT: [place] AT, moist mucus membranes.  Trachea midline, no masses. Cardiovascular: No clubbing, cyanosis, or edema. Respiratory: Normal respiratory effort, no increased work of breathing. Skin: No rashes, bruises or suspicious lesions. Neurologic: Grossly intact, no focal deficits, moving all 4 extremities. Psychiatric: Normal mood and affect.  Laboratory Data:     Assessment & Plan:   Malignant neoplasm of prostate ([place]) - High risk prostate cancer, s/p IMRT and ADT (course complete, last depo 1 year ago) - PSA remains undetectable, NED - Will recheck PSA in 6 mo then 1 year   2. Urinary frequency - Continue Flomax, symptoms well controlled on this medication - Refill sent   3. History of hypogonadism  - He would like his testosterone rechecked 6 months from now to see how his testosterone is recovering  Follow-up labs in 6 months with PSA/testosterone as well as 1 year with PSA MD visit  Conley Rolls as a scribe for Hollice Espy, MD.,have documented all relevant documentation on the behalf of Hollice Espy, MD,as directed by  Hollice Espy, MD while in the presence of Hollice Espy, MD.  I have reviewed the above documentation for accuracy and completeness, and I agree with the above.   Hollice Espy, MD   [facility] [address], [place], [place] [address] [phone]

## 2022-07-18 ENCOUNTER — Encounter: Payer: Self-pay | Admitting: *Deleted

## 2022-07-19 ENCOUNTER — Ambulatory Visit: Payer: Medicare PPO | Admitting: Anesthesiology

## 2022-07-19 ENCOUNTER — Ambulatory Visit
Admission: RE | Admit: 2022-07-19 | Discharge: 2022-07-19 | Disposition: A | Discharge status: Home / Self Care | Payer: Medicare PPO | Source: Ambulatory Visit | Attending: Gastroenterology | Admitting: Gastroenterology

## 2022-07-19 ENCOUNTER — Encounter
Admission: RE | Disposition: A | Discharge status: Home / Self Care | Payer: Self-pay | Source: Ambulatory Visit | Attending: Gastroenterology

## 2022-07-19 ENCOUNTER — Encounter: Payer: Self-pay | Admitting: *Deleted

## 2022-07-19 DIAGNOSIS — K3189 Other diseases of stomach and duodenum: Secondary | ICD-10-CM | POA: Diagnosis not present

## 2022-07-19 DIAGNOSIS — Z86010 Personal history of colonic polyps: Secondary | ICD-10-CM | POA: Diagnosis not present

## 2022-07-19 DIAGNOSIS — K2970 Gastritis, unspecified, without bleeding: Secondary | ICD-10-CM | POA: Diagnosis not present

## 2022-07-19 DIAGNOSIS — Z6833 Body mass index (BMI) 33.0-33.9, adult: Secondary | ICD-10-CM | POA: Insufficient documentation

## 2022-07-19 DIAGNOSIS — G4730 Sleep apnea, unspecified: Secondary | ICD-10-CM | POA: Diagnosis not present

## 2022-07-19 DIAGNOSIS — K449 Diaphragmatic hernia without obstruction or gangrene: Secondary | ICD-10-CM | POA: Diagnosis not present

## 2022-07-19 DIAGNOSIS — Z85820 Personal history of malignant melanoma of skin: Secondary | ICD-10-CM | POA: Insufficient documentation

## 2022-07-19 DIAGNOSIS — E669 Obesity, unspecified: Secondary | ICD-10-CM | POA: Insufficient documentation

## 2022-07-19 DIAGNOSIS — D125 Benign neoplasm of sigmoid colon: Secondary | ICD-10-CM | POA: Diagnosis not present

## 2022-07-19 DIAGNOSIS — D123 Benign neoplasm of transverse colon: Secondary | ICD-10-CM | POA: Diagnosis not present

## 2022-07-19 DIAGNOSIS — I10 Essential (primary) hypertension: Secondary | ICD-10-CM | POA: Diagnosis not present

## 2022-07-19 DIAGNOSIS — K219 Gastro-esophageal reflux disease without esophagitis: Secondary | ICD-10-CM | POA: Diagnosis not present

## 2022-07-19 DIAGNOSIS — Z8546 Personal history of malignant neoplasm of prostate: Secondary | ICD-10-CM | POA: Diagnosis not present

## 2022-07-19 DIAGNOSIS — Z79899 Other long term (current) drug therapy: Secondary | ICD-10-CM | POA: Diagnosis not present

## 2022-07-19 DIAGNOSIS — K640 First degree hemorrhoids: Secondary | ICD-10-CM | POA: Insufficient documentation

## 2022-07-19 DIAGNOSIS — Z79624 Long term (current) use of inhibitors of nucleotide synthesis: Secondary | ICD-10-CM | POA: Insufficient documentation

## 2022-07-19 DIAGNOSIS — Z85828 Personal history of other malignant neoplasm of skin: Secondary | ICD-10-CM | POA: Diagnosis not present

## 2022-07-19 DIAGNOSIS — Z1211 Encounter for screening for malignant neoplasm of colon: Secondary | ICD-10-CM | POA: Insufficient documentation

## 2022-07-19 HISTORY — PX: ESOPHAGOGASTRODUODENOSCOPY (EGD) WITH PROPOFOL: SHX5813

## 2022-07-19 HISTORY — PX: COLONOSCOPY WITH PROPOFOL: SHX5780

## 2022-07-19 SURGERY — COLONOSCOPY WITH PROPOFOL
Anesthesia: General

## 2022-07-19 MED ORDER — LIDOCAINE HCL (CARDIAC) PF 100 MG/5ML IV SOSY
PREFILLED_SYRINGE | INTRAVENOUS | Status: DC | PRN
  Administered 2022-07-19: 100 mg via INTRAVENOUS

## 2022-07-19 MED ORDER — LIDOCAINE HCL (PF) 2 % IJ SOLN
INTRAMUSCULAR | Status: AC
  Filled 2022-07-19: qty 5

## 2022-07-19 MED ORDER — PROPOFOL 10 MG/ML IV BOLUS
INTRAVENOUS | Status: AC
  Filled 2022-07-19: qty 40

## 2022-07-19 MED ORDER — PHENYLEPHRINE 80 MCG/ML (10ML) SYRINGE FOR IV PUSH (FOR BLOOD PRESSURE SUPPORT)
PREFILLED_SYRINGE | INTRAVENOUS | Status: AC
  Filled 2022-07-19: qty 10

## 2022-07-19 MED ORDER — SODIUM CHLORIDE 0.9 % IV SOLN: INTRAVENOUS | Status: DC

## 2022-07-19 MED ORDER — PROPOFOL 10 MG/ML IV BOLUS
INTRAVENOUS | Status: AC
  Filled 2022-07-19: qty 20

## 2022-07-19 MED ORDER — PHENYLEPHRINE HCL (PRESSORS) 10 MG/ML IV SOLN
INTRAVENOUS | Status: DC | PRN
  Administered 2022-07-19: 80 ug via INTRAVENOUS

## 2022-07-19 MED ORDER — PROPOFOL 10 MG/ML IV BOLUS
INTRAVENOUS | Status: DC | PRN
  Administered 2022-07-19: 120 mg via INTRAVENOUS
  Administered 2022-07-19: 120 ug/kg/min via INTRAVENOUS

## 2022-07-19 MED ORDER — GLYCOPYRROLATE 0.2 MG/ML IJ SOLN
INTRAMUSCULAR | Status: AC
  Filled 2022-07-19: qty 1

## 2022-07-19 NOTE — Anesthesia Preprocedure Evaluation (Signed)
Anesthesia Evaluation  Patient identified by MRN, date of birth, ID band Patient awake    Reviewed: Allergy & Precautions, NPO status , Patient's Chart, lab work & pertinent test results  History of Anesthesia Complications Negative for: history of anesthetic complications  Airway Mallampati: III  TM Distance: <3 FB Neck ROM: full    Dental  (+) Chipped   Pulmonary neg shortness of breath, sleep apnea ,    Pulmonary exam normal        Cardiovascular Exercise Tolerance: Good hypertension, (-) anginaNormal cardiovascular exam     Neuro/Psych PSYCHIATRIC DISORDERS negative neurological ROS     GI/Hepatic Neg liver ROS, GERD  Controlled,  Endo/Other  negative endocrine ROS  Renal/GU negative Renal ROS  negative genitourinary   Musculoskeletal   Abdominal   Peds  Hematology negative hematology ROS (+)   Anesthesia Other Findings Past Medical History: 09/21/2015: Basal cell carcinoma     Comment:  left infranasal on the sup philtrum 02/26/2017: Basal cell carcinoma     Comment:  right of midline forehead 08/02/2020: Basal cell carcinoma     Comment:  L cheek infra auricular  No date: Chronic ankle pain No date: DJD (degenerative joint disease) No date: GERD (gastroesophageal reflux disease) No date: H/O endoscopy No date: History of stomach ulcers No date: Hx of colonoscopy No date: Hypertension 10/10/2015: Melanoma ([place])     Comment:  left upper back post base of neck, insitu/excision No date: Obesity 2020: Prostate CA ([place]) No date: Reflux No date: Skin cancer No date: Sleep apnea     Comment:  using upper/lower mouth piece since 2016 ( not using               CPAP)   Past Surgical History: No date: ABSCESS DRAINAGE 08/30/2019: RADIOACTIVE SEED IMPLANT; N/A     Comment:  Procedure: RADIOACTIVE SEED IMPLANT/BRACHYTHERAPY               IMPLANT;  Surgeon: Hollice Espy, MD;  Location: [facility]                ORS;  Service: Urology;  Laterality: N/A; 05/31/2019: ROBOTIC PELVIC AND PARA-AORTIC LYMPH NODE DISSECTION; Left     Comment:  Procedure: XI ROBOTIC PELVIC lymph NODE DISSECTION;                Surgeon: Hollice Espy, MD;  Location: [facility] ORS;                Service: Urology;  Laterality: Left;  BMI    Body Mass Index: 33.25 kg/m      Reproductive/Obstetrics negative OB ROS                             Anesthesia Physical Anesthesia Plan  ASA: 3  Anesthesia Plan: General   Post-op Pain Management:    Induction: Intravenous  PONV Risk Score and Plan: Propofol infusion and TIVA  Airway Management Planned: Natural Airway and Nasal Cannula  Additional Equipment:   Intra-op Plan:   Post-operative Plan:   Informed Consent: I have reviewed the patients History and Physical, chart, labs and discussed the procedure including the risks, benefits and alternatives for the proposed anesthesia with the patient or authorized representative who has indicated his/her understanding and acceptance.     Dental Advisory Given  Plan Discussed with: Anesthesiologist, CRNA and Surgeon  Anesthesia Plan Comments: (Patient consented for risks of anesthesia including but not limited to:  -  adverse reactions to medications - risk of airway placement if required - damage to eyes, teeth, lips or other oral mucosa - nerve damage due to positioning  - sore throat or hoarseness - Damage to heart, brain, nerves, lungs, other parts of body or loss of life  Patient voiced understanding.)        Anesthesia Quick Evaluation

## 2022-07-19 NOTE — Interval H&P Note (Signed)
History and Physical Interval Note:  07/19/2022 8:24 AM  Shawn Melton  has presented today for surgery, with the diagnosis of HX TA polyps GERD.  The various methods of treatment have been discussed with the patient and family. After consideration of risks, benefits and other options for treatment, the patient has consented to  Procedure(s): COLONOSCOPY WITH PROPOFOL (N/A) ESOPHAGOGASTRODUODENOSCOPY (EGD) WITH PROPOFOL (N/A) as a surgical intervention.  The patient's history has been reviewed, patient examined, no change in status, stable for surgery.  I have reviewed the patient's chart and labs.  Questions were answered to the patient's satisfaction.     Lesly Rubenstein  Ok to proceed with EGD/Colonoscopy

## 2022-07-19 NOTE — Op Note (Signed)
[facility] Gastroenterology Patient Name: Shawn Melton Procedure Date: 07/19/2022 8:22 AM MRN: [id number] Account #: [id number] Date of Birth: 10-20-1956 Admit Type: Outpatient Age: 66 Room: [facility] ENDO ROOM 3 Gender: Male Note Status: Finalized Instrument Name: Park Meo [id number] Procedure:             Colonoscopy Indications:           High risk colon cancer surveillance: Personal history                         of non-advanced adenoma, Last colonoscopy 5 years ago Providers:             Andrey Farmer MD, MD Referring MD:          Baxter Hire, MD (Referring MD) Medicines:             Monitored Anesthesia Care Complications:         No immediate complications. Estimated blood loss:                         Minimal. Procedure:             Pre-Anesthesia Assessment:                        - Prior to the procedure, a History and Physical was                         performed, and patient medications and allergies were                         reviewed. The patient is competent. The risks and                         benefits of the procedure and the sedation options and                         risks were discussed with the patient. All questions                         were answered and informed consent was obtained.                         Patient identification and proposed procedure were                         verified by the physician, the nurse, the                         anesthesiologist, the anesthetist and the technician                         in the endoscopy suite. Mental Status Examination:                         alert and oriented. Airway Examination: normal                         oropharyngeal airway and neck mobility. Respiratory  Examination: clear to auscultation. CV Examination:                         normal. Prophylactic Antibiotics: The patient does not                         require prophylactic antibiotics. Prior                          Anticoagulants: The patient has taken no previous                         anticoagulant or antiplatelet agents. ASA Grade                         Assessment: III - A patient with severe systemic                         disease. After reviewing the risks and benefits, the                         patient was deemed in satisfactory condition to                         undergo the procedure. The anesthesia plan was to use                         monitored anesthesia care (MAC). Immediately prior to                         administration of medications, the patient was                         re-assessed for adequacy to receive sedatives. The                         heart rate, respiratory rate, oxygen saturations,                         blood pressure, adequacy of pulmonary ventilation, and                         response to care were monitored throughout the                         procedure. The physical status of the patient was                         re-assessed after the procedure.                        After obtaining informed consent, the colonoscope was                         passed under direct vision. Throughout the procedure,                         the patient's blood pressure, pulse, and oxygen  saturations were monitored continuously. The                         Colonoscope was introduced through the anus and                         advanced to the the cecum, identified by appendiceal                         orifice and ileocecal valve. The colonoscopy was                         performed without difficulty. The patient tolerated                         the procedure well. The quality of the bowel                         preparation was good. Findings:      The perianal and digital rectal examinations were normal.      Internal hemorrhoids were found during retroflexion. The hemorrhoids       were Grade I (internal hemorrhoids  that do not prolapse).      A 3 mm polyp was found in the transverse colon. The polyp was sessile.       The polyp was removed with a cold snare. Resection and retrieval were       complete. Estimated blood loss was minimal.      A 4 mm polyp was found in the sigmoid colon. The polyp was       semi-pedunculated. The polyp was removed with a cold snare. Resection       and retrieval were complete. Estimated blood loss was minimal.      The exam was otherwise without abnormality on direct and retroflexion       views. Impression:            - Internal hemorrhoids.                        - One 3 mm polyp in the transverse colon, removed with                         a cold snare. Resected and retrieved.                        - One 4 mm polyp in the sigmoid colon, removed with a                         cold snare. Resected and retrieved.                        - The examination was otherwise normal on direct and                         retroflexion views. Recommendation:        - Discharge patient to home.                        - Resume previous diet.                        -  Continue present medications.                        - Await pathology results.                        - Repeat colonoscopy for surveillance based on                         pathology results.                        - Return to referring physician as previously                         scheduled. Procedure Code(s):     --- Professional ---                        [phone], Colonoscopy, flexible; with removal of                         tumor(s), polyp(s), or other lesion(s) by snare                         technique Diagnosis Code(s):     --- Professional ---                        Z86.010, Personal history of colonic polyps                        K63.5, Polyp of colon                        K64.0, First degree hemorrhoids CPT copyright 2019 American Medical Association. All rights reserved. The codes documented in this  report are preliminary and upon coder review may  be revised to meet current compliance requirements. Andrey Farmer MD, MD 07/19/2022 9:09:59 AM Number of Addenda: 0 Note Initiated On: 07/19/2022 8:22 AM Scope Withdrawal Time: 0 hours 10 minutes 38 seconds  Total Procedure Duration: 0 hours 14 minutes 24 seconds  Estimated Blood Loss:  Estimated blood loss was minimal.      [facility]

## 2022-07-19 NOTE — H&P (Signed)
Outpatient short stay form Pre-procedure 07/19/2022  Lesly Rubenstein, MD  Primary Physician: Baxter Hire, MD  Reason for visit:  GERD/Surveillance  History of present illness:    66 y/o gentleman with history of hypertension, GERD, and obesity here for EGD/Colonoscopy for GERD and history of tubular adenomas. Last colonoscopy in 2018 was unremarkable. No blood thinners. No first degree relatives with GI malignancies. History of laprascopic surgery for prostate but it was not removed, only received radiation and brachytherapy.    Current Facility-Administered Medications:    0.9 %  sodium chloride infusion, , Intravenous, Continuous, Phenix Vandermeulen, Hilton Cork, MD, Last Rate: 20 mL/hr at 07/19/22 0719, New Bag at 07/19/22 0719  Medications Prior to Admission  Medication Sig Dispense Refill Last Dose   losartan (COZAAR) 50 MG tablet Take 50 mg by mouth at bedtime.    07/18/2022   omeprazole (PRILOSEC) 40 MG capsule Take 40 mg by mouth daily before breakfast.    07/18/2022   acyclovir (ZOVIRAX) 400 MG tablet TAKE 1 TABLET BY MOUTH 3 TIMES A DAY 270 tablet 12    aspirin 81 MG EC tablet Take by mouth.   07/05/2022   fluticasone (FLONASE) 50 MCG/ACT nasal spray Place 2 sprays into both nostrils daily as needed for allergies or rhinitis.      ibuprofen (ADVIL) 200 MG tablet Take 400 mg by mouth every 8 (eight) hours as needed for moderate pain.    06/13/2022   Multiple Vitamin (MULTIVITAMIN WITH MINERALS) TABS tablet Take 1 tablet by mouth daily.      tamsulosin (FLOMAX) 0.4 MG CAPS capsule Take 1 capsule (0.4 mg total) by mouth daily. 90 capsule 3      Allergies  Allergen Reactions   Neosporin [Bacitracin-Polymyxin B] Itching and Other (See Comments)    redness     Past Medical History:  Diagnosis Date   Basal cell carcinoma 09/21/2015   left infranasal on the sup philtrum   Basal cell carcinoma 02/26/2017   right of midline forehead   Basal cell carcinoma 08/02/2020   L cheek infra  auricular    Chronic ankle pain    DJD (degenerative joint disease)    GERD (gastroesophageal reflux disease)    H/O endoscopy    History of stomach ulcers    Hx of colonoscopy    Hypertension    Melanoma ([place]) 10/10/2015   left upper back post base of neck, insitu/excision   Obesity    Prostate CA ([place]) 2020   Reflux    Skin cancer    Sleep apnea    using upper/lower mouth piece since 2016 ( not using CPAP)     Review of systems:  Otherwise negative.    Physical Exam  Gen: Alert, oriented. Appears stated age.  HEENT: PERRLA. Lungs: No respiratory distress CV: RRR Abd: soft, benign, no masses Ext: No edema    Planned procedures: Proceed with colonoscopy. The patient understands the nature of the planned procedure, indications, risks, alternatives and potential complications including but not limited to bleeding, infection, perforation, damage to internal organs and possible oversedation/side effects from anesthesia. The patient agrees and gives consent to proceed.  Please refer to procedure notes for findings, recommendations and patient disposition/instructions.     Lesly Rubenstein, MD [facility] Gastroenterology

## 2022-07-19 NOTE — Transfer of Care (Signed)
Immediate Anesthesia Transfer of Care Note  Patient: Shawn Melton  Procedure(s) Performed: COLONOSCOPY WITH PROPOFOL ESOPHAGOGASTRODUODENOSCOPY (EGD) WITH PROPOFOL  Patient Location: PACU and Endoscopy Unit  Anesthesia Type:General  Level of Consciousness: drowsy  Airway & Oxygen Therapy: Patient Spontanous Breathing and Patient connected to face mask oxygen  Post-op Assessment: Report given to RN and Post -op Vital signs reviewed and stable  Post vital signs: Reviewed and stable  Last Vitals:  Vitals Value Taken Time  BP 98/52 07/19/22 0909  Temp 35.8   Pulse 56 07/19/22 0907  Resp 17 07/19/22 0907  SpO2 97 % 07/19/22 0907  Vitals shown include unvalidated device data.  Last Pain:  Vitals:   07/19/22 0701  TempSrc: Temporal  PainSc: 0-No pain         Complications: No notable events documented.

## 2022-07-19 NOTE — Op Note (Signed)
[facility] Gastroenterology Patient Name: Shawn Melton Procedure Date: 07/19/2022 8:23 AM MRN: [id number] Account #: [id number] Date of Birth: September 05, 1956 Admit Type: Outpatient Age: 66 Room: [facility] ENDO ROOM 3 Gender: Male Note Status: Finalized Instrument Name: Upper Endoscope [id number] Procedure:             Upper GI endoscopy Indications:           Gastro-esophageal reflux disease Providers:             Andrey Farmer MD, MD Referring MD:          Baxter Hire, MD (Referring MD) Medicines:             Monitored Anesthesia Care Complications:         No immediate complications. Estimated blood loss:                         Minimal. Procedure:             Pre-Anesthesia Assessment:                        - Prior to the procedure, a History and Physical was                         performed, and patient medications and allergies were                         reviewed. The patient is competent. The risks and                         benefits of the procedure and the sedation options and                         risks were discussed with the patient. All questions                         were answered and informed consent was obtained.                         Patient identification and proposed procedure were                         verified by the physician, the nurse, the                         anesthesiologist, the anesthetist and the technician                         in the endoscopy suite. Mental Status Examination:                         alert and oriented. Airway Examination: normal                         oropharyngeal airway and neck mobility. Respiratory                         Examination: clear to auscultation. CV Examination:  normal. Prophylactic Antibiotics: The patient does not                         require prophylactic antibiotics. Prior                         Anticoagulants: The patient has taken no previous                          anticoagulant or antiplatelet agents. ASA Grade                         Assessment: III - A patient with severe systemic                         disease. After reviewing the risks and benefits, the                         patient was deemed in satisfactory condition to                         undergo the procedure. The anesthesia plan was to use                         monitored anesthesia care (MAC). Immediately prior to                         administration of medications, the patient was                         re-assessed for adequacy to receive sedatives. The                         heart rate, respiratory rate, oxygen saturations,                         blood pressure, adequacy of pulmonary ventilation, and                         response to care were monitored throughout the                         procedure. The physical status of the patient was                         re-assessed after the procedure.                        After obtaining informed consent, the endoscope was                         passed under direct vision. Throughout the procedure,                         the patient's blood pressure, pulse, and oxygen                         saturations were monitored continuously. The Endoscope  was introduced through the mouth, and advanced to the                         second part of duodenum. The upper GI endoscopy was                         accomplished without difficulty. The patient tolerated                         the procedure well. Findings:      A small hiatal hernia was present.      The exam of the esophagus was otherwise normal.      Scattered mild inflammation characterized by erythema was found in the       gastric antrum. Six biopsies were obtained in the gastric antrum with       cold forceps for histology. Two biopsies were obtained with cold forceps       for histology on the lesser curvature of the gastric body, as  well as       two biopsies on the greater curvature of the gastric body and two       biopsies. Estimated blood loss was minimal.      The examined duodenum was normal. Impression:            - Small hiatal hernia.                        - Gastritis.                        - Normal examined duodenum.                        - Six biopsies were obtained in the gastric antrum.                        - Biopsies performed on the lesser curvature of the                         gastric body and on the greater curvature of the                         gastric body. Recommendation:        - Await pathology results.                        - Perform a colonoscopy today. Procedure Code(s):     --- Professional ---                        [phone], Esophagogastroduodenoscopy, flexible,                         transoral; with biopsy, single or multiple Diagnosis Code(s):     --- Professional ---                        K44.9, Diaphragmatic hernia without obstruction or                         gangrene  K29.70, Gastritis, unspecified, without bleeding                        K21.9, Gastro-esophageal reflux disease without                         esophagitis CPT copyright 2019 American Medical Association. All rights reserved. The codes documented in this report are preliminary and upon coder review may  be revised to meet current compliance requirements. Andrey Farmer MD, MD 07/19/2022 9:06:46 AM Number of Addenda: 0 Note Initiated On: 07/19/2022 8:23 AM Estimated Blood Loss:  Estimated blood loss was minimal.      [facility]

## 2022-07-19 NOTE — Anesthesia Postprocedure Evaluation (Signed)
Anesthesia Post Note  Patient: Shawn Melton  Procedure(s) Performed: COLONOSCOPY WITH PROPOFOL ESOPHAGOGASTRODUODENOSCOPY (EGD) WITH PROPOFOL  Patient location during evaluation: Endoscopy Anesthesia Type: General Level of consciousness: awake and alert Pain management: pain level controlled Vital Signs Assessment: post-procedure vital signs reviewed and stable Respiratory status: spontaneous breathing, nonlabored ventilation, respiratory function stable and patient connected to nasal cannula oxygen Cardiovascular status: blood pressure returned to baseline and stable Postop Assessment: no apparent nausea or vomiting Anesthetic complications: no   No notable events documented.   Last Vitals:  Vitals:   07/19/22 0917 07/19/22 0927  BP:    Pulse: 67   Resp:    Temp:    SpO2: 97% 99%    Last Pain:  Vitals:   07/19/22 0927  TempSrc:   PainSc: 0-No pain                 Precious Haws [facility]st

## 2022-07-22 LAB — SURGICAL PATHOLOGY

## 2022-09-30 ENCOUNTER — Encounter (INDEPENDENT_AMBULATORY_CARE_PROVIDER_SITE_OTHER): Payer: Self-pay

## 2022-11-05 ENCOUNTER — Other Ambulatory Visit: Payer: Self-pay | Admitting: Dermatology

## 2022-11-05 DIAGNOSIS — B009 Herpesviral infection, unspecified: Secondary | ICD-10-CM

## 2022-12-09 ENCOUNTER — Other Ambulatory Visit: Payer: Medicare PPO

## 2022-12-09 DIAGNOSIS — C61 Malignant neoplasm of prostate: Secondary | ICD-10-CM

## 2022-12-10 LAB — TESTOSTERONE: Testosterone: 534 ng/dL (ref 264–916)

## 2022-12-10 LAB — PSA: Prostate Specific Ag, Serum: 0.2 ng/mL (ref 0.0–4.0)

## 2022-12-20 ENCOUNTER — Other Ambulatory Visit: Payer: Medicare PPO

## 2023-02-17 ENCOUNTER — Ambulatory Visit: Payer: Medicare PPO | Admitting: Dermatology

## 2023-04-09 ENCOUNTER — Ambulatory Visit: Payer: Medicare PPO | Admitting: Dermatology

## 2023-04-09 VITALS — BP 101/76 | HR 79

## 2023-04-09 DIAGNOSIS — Z85828 Personal history of other malignant neoplasm of skin: Secondary | ICD-10-CM

## 2023-04-09 DIAGNOSIS — D229 Melanocytic nevi, unspecified: Secondary | ICD-10-CM

## 2023-04-09 DIAGNOSIS — Z1283 Encounter for screening for malignant neoplasm of skin: Secondary | ICD-10-CM | POA: Diagnosis not present

## 2023-04-09 DIAGNOSIS — Z85820 Personal history of malignant melanoma of skin: Secondary | ICD-10-CM

## 2023-04-09 DIAGNOSIS — L578 Other skin changes due to chronic exposure to nonionizing radiation: Secondary | ICD-10-CM

## 2023-04-09 DIAGNOSIS — L821 Other seborrheic keratosis: Secondary | ICD-10-CM | POA: Diagnosis not present

## 2023-04-09 DIAGNOSIS — L820 Inflamed seborrheic keratosis: Secondary | ICD-10-CM

## 2023-04-09 DIAGNOSIS — D1801 Hemangioma of skin and subcutaneous tissue: Secondary | ICD-10-CM

## 2023-04-09 DIAGNOSIS — L570 Actinic keratosis: Secondary | ICD-10-CM | POA: Diagnosis not present

## 2023-04-09 DIAGNOSIS — X32XXXA Exposure to sunlight, initial encounter: Secondary | ICD-10-CM

## 2023-04-09 DIAGNOSIS — W908XXA Exposure to other nonionizing radiation, initial encounter: Secondary | ICD-10-CM

## 2023-04-09 DIAGNOSIS — L814 Other melanin hyperpigmentation: Secondary | ICD-10-CM

## 2023-04-09 NOTE — Patient Instructions (Addendum)
Cryotherapy Aftercare  Wash gently with soap and water everyday.   Apply Vaseline and Band-Aid daily until healed.     Due to recent changes in healthcare laws, you may see results of your pathology and/or laboratory studies on MyChart before the doctors have had a chance to review them. We understand that in some cases there may be results that are confusing or concerning to you. Please understand that not all results are received at the same time and often the doctors may need to interpret multiple results in order to provide you with the best plan of care or course of treatment. Therefore, we ask that you please give us 2 business days to thoroughly review all your results before contacting the office for clarification. Should we see a critical lab result, you will be contacted sooner.   If You Need Anything After Your Visit  If you have any questions or concerns for your doctor, please call our main line at [phone] and press option 4 to reach your doctor's medical assistant. If no one answers, please leave a voicemail as directed and we will return your call as soon as possible. Messages left after 4 pm will be answered the following business day.   You may also send us a message via MyChart. We typically respond to MyChart messages within 1-2 business days.  For prescription refills, please ask your pharmacy to contact our office. Our fax number is [phone].  If you have an urgent issue when the clinic is closed that cannot wait until the next business day, you can page your doctor at the number below.    Please note that while we do our best to be available for urgent issues outside of office hours, we are not available 24/7.   If you have an urgent issue and are unable to reach us, you may choose to seek medical care at your doctor's office, retail clinic, urgent care center, or emergency room.  If you have a medical emergency, please immediately call 911 or go to the  emergency department.  Pager Numbers  - Dr. Kowalski: [phone]  - Dr. Moye: [phone]  - Dr. Stewart: [phone]  In the event of inclement weather, please call our main line at [phone] for an update on the status of any delays or closures.  Dermatology Medication Tips: Please keep the boxes that topical medications come in in order to help keep track of the instructions about where and how to use these. Pharmacies typically print the medication instructions only on the boxes and not directly on the medication tubes.   If your medication is too expensive, please contact our office at [phone] option 4 or send us a message through MyChart.   We are unable to tell what your co-pay for medications will be in advance as this is different depending on your insurance coverage. However, we may be able to find a substitute medication at lower cost or fill out paperwork to get insurance to cover a needed medication.   If a prior authorization is required to get your medication covered by your insurance company, please allow us 1-2 business days to complete this process.  Drug prices often vary depending on where the prescription is filled and some pharmacies may offer cheaper prices.  The website www.goodrx.com contains coupons for medications through different pharmacies. The prices here do not account for what the cost may be with help from insurance (it may be cheaper with your insurance), but the website can   give you the price if you did not use any insurance.  - You can print the associated coupon and take it with your prescription to the pharmacy.  - You may also stop by our office during regular business hours and pick up a GoodRx coupon card.  - If you need your prescription sent electronically to a different pharmacy, notify our office through [place] MyChart or by phone at [phone] option 4.     Si Usted Necesita Algo Despus de Su Visita  Tambin puede  enviarnos un mensaje a travs de MyChart. Por lo general respondemos a los mensajes de MyChart en el transcurso de 1 a 2 das hbiles.  Para renovar recetas, por favor pida a su farmacia que se ponga en contacto con nuestra oficina. Nuestro nmero de fax es el [phone].  Si tiene un asunto urgente cuando la clnica est cerrada y que no puede esperar hasta el siguiente da hbil, puede llamar/localizar a su doctor(a) al nmero que aparece a continuacin.   Por favor, tenga en cuenta que aunque hacemos todo lo posible para estar disponibles para asuntos urgentes fuera del horario de oficina, no estamos disponibles las 24 horas del da, los 7 das de la semana.   Si tiene un problema urgente y no puede comunicarse con nosotros, puede optar por buscar atencin mdica  en el consultorio de su doctor(a), en una clnica privada, en un centro de atencin urgente o en una sala de emergencias.  Si tiene una emergencia mdica, por favor llame inmediatamente al 911 o vaya a la sala de emergencias.  Nmeros de bper  - Dr. Kowalski: [phone]  - Dra. Moye: [phone]  - Dra. Stewart: [phone]  En caso de inclemencias del tiempo, por favor llame a nuestra lnea principal al [phone] para una actualizacin sobre el estado de cualquier retraso o cierre.  Consejos para la medicacin en dermatologa: Por favor, guarde las cajas en las que vienen los medicamentos de uso tpico para ayudarle a seguir las instrucciones sobre dnde y cmo usarlos. Las farmacias generalmente imprimen las instrucciones del medicamento slo en las cajas y no directamente en los tubos del medicamento.   Si su medicamento es muy caro, por favor, pngase en contacto con nuestra oficina llamando al [phone] y presione la opcin 4 o envenos un mensaje a travs de MyChart.   No podemos decirle cul ser su copago por los medicamentos por adelantado ya que esto es diferente dependiendo de la cobertura de su seguro.  Sin embargo, es posible que podamos encontrar un medicamento sustituto a menor costo o llenar un formulario para que el seguro cubra el medicamento que se considera necesario.   Si se requiere una autorizacin previa para que su compaa de seguros cubra su medicamento, por favor permtanos de 1 a 2 das hbiles para completar este proceso.  Los precios de los medicamentos varan con frecuencia dependiendo del lugar de dnde se surte la receta y alguna farmacias pueden ofrecer precios ms baratos.  El sitio web www.goodrx.com tiene cupones para medicamentos de diferentes farmacias. Los precios aqu no tienen en cuenta lo que podra costar con la ayuda del seguro (puede ser ms barato con su seguro), pero el sitio web puede darle el precio si no utiliz ningn seguro.  - Puede imprimir el cupn correspondiente y llevarlo con su receta a la farmacia.  - Tambin puede pasar por nuestra oficina durante el horario de atencin regular y recoger una tarjeta de cupones de GoodRx.  -   Si necesita que su receta se enve electrnicamente a una farmacia diferente, informe a nuestra oficina a travs de MyChart de [place] o por telfono llamando al [phone] y presione la opcin 4.  

## 2023-04-09 NOTE — Progress Notes (Signed)
Follow-Up Visit   Subjective  Shawn Melton is a 67 y.o. male who presents for the following: Skin Cancer Screening and Full Body Skin Exam, hx of Melanoma, hx of BCC   The patient presents for Total-Body Skin Exam (TBSE) for skin cancer screening and mole check. The patient has spots, moles and lesions to be evaluated, some may be new or changing and the patient has concerns that these could be cancer.    The following portions of the chart were reviewed this encounter and updated as appropriate: medications, allergies, medical history  Review of Systems:  No other skin or systemic complaints except as noted in HPI or Assessment and Plan.  Objective  Well appearing patient in no apparent distress; mood and affect are within normal limits.  A full examination was performed including scalp, head, eyes, ears, nose, lips, neck, chest, axillae, abdomen, back, buttocks, bilateral upper extremities, bilateral lower extremities, hands, feet, fingers, toes, fingernails, and toenails. All findings within normal limits unless otherwise noted below.   Relevant physical exam findings are noted in the Assessment and Plan.  Scalp x 7 (7) Erythematous thin papules/macules with gritty scale.   right sup forehead x 1 Stuck-on, waxy, tan-brown papule  --Discussed benign etiology and prognosis.     Assessment & Plan   LENTIGINES, SEBORRHEIC KERATOSES, HEMANGIOMAS - Benign normal skin lesions - Benign-appearing - Call for any changes  MELANOCYTIC NEVI - Tan-brown and/or pink-flesh-colored symmetric macules and papules - Benign appearing on exam today - Observation - Call clinic for new or changing moles - Recommend daily use of broad spectrum spf 30+ sunscreen to sun-exposed areas.   ACTINIC DAMAGE - Chronic condition, secondary to cumulative UV/sun exposure - diffuse scaly erythematous macules with underlying dyspigmentation - Recommend daily broad spectrum sunscreen SPF 30+ to sun-exposed  areas, reapply every 2 hours as needed.  - Staying in the shade or wearing long sleeves, sun glasses (UVA+UVB protection) and wide brim hats (4-inch brim around the entire circumference of the hat) are also recommended for sun protection.  - Call for new or changing lesions.  HISTORY OF BASAL CELL CARCINOMA OF THE SKIN Multiple see history - No evidence of recurrence today - Recommend regular full body skin exams - Recommend daily broad spectrum sunscreen SPF 30+ to sun-exposed areas, reapply every 2 hours as needed.  - Call if any new or changing lesions are noted between office visits    HISTORY OF MELANOMA Left upper back post of base of neck 2016 - No evidence of recurrence today - No lymphadenopathy - Recommend regular full body skin exams - Recommend daily broad spectrum sunscreen SPF 30+ to sun-exposed areas, reapply every 2 hours as needed.  - Call if any new or changing lesions are noted between office visits     SKIN CANCER SCREENING PERFORMED TODAY.  AK (actinic keratosis) (7) Scalp x 7  Actinic keratoses are precancerous spots that appear secondary to cumulative UV radiation exposure/sun exposure over time. They are chronic with expected duration over 1 year. A portion of actinic keratoses will progress to squamous cell carcinoma of the skin. It is not possible to reliably predict which spots will progress to skin cancer and so treatment is recommended to prevent development of skin cancer.  Recommend daily broad spectrum sunscreen SPF 30+ to sun-exposed areas, reapply every 2 hours as needed.  Recommend staying in the shade or wearing long sleeves, sun glasses (UVA+UVB protection) and wide brim hats (4-inch brim around the  entire circumference of the hat). Call for new or changing lesions.   Destruction of lesion - Scalp x 7 Complexity: simple   Destruction method: cryotherapy   Informed consent: discussed and consent obtained   Timeout:  patient name, date of birth,  surgical site, and procedure verified Lesion destroyed using liquid nitrogen: Yes   Region frozen until ice ball extended beyond lesion: Yes   Outcome: patient tolerated procedure well with no complications   Post-procedure details: wound care instructions given    Inflamed seborrheic keratosis right sup forehead x 1  Symptomatic, irritating, patient would like treated.   Destruction of lesion - right sup forehead x 1 Complexity: simple   Destruction method: cryotherapy   Informed consent: discussed and consent obtained   Timeout:  patient name, date of birth, surgical site, and procedure verified Lesion destroyed using liquid nitrogen: Yes   Region frozen until ice ball extended beyond lesion: Yes   Outcome: patient tolerated procedure well with no complications   Post-procedure details: wound care instructions given     Return in about 1 year (around 04/08/2024) for TBSE, hx of Melanoma, hx of BCC.  IAngelique Holm, CMA, am acting as scribe for Armida Sans, MD .   Documentation: I have reviewed the above documentation for accuracy and completeness, and I agree with the above.  Armida Sans, MD

## 2023-04-15 ENCOUNTER — Encounter: Payer: Self-pay | Admitting: Dermatology

## 2023-05-04 ENCOUNTER — Other Ambulatory Visit: Payer: Self-pay | Admitting: Dermatology

## 2023-05-04 DIAGNOSIS — B009 Herpesviral infection, unspecified: Secondary | ICD-10-CM

## 2023-06-23 ENCOUNTER — Other Ambulatory Visit: Payer: Self-pay | Admitting: Urology

## 2023-06-23 ENCOUNTER — Other Ambulatory Visit: Payer: Medicare PPO

## 2023-06-23 DIAGNOSIS — C61 Malignant neoplasm of prostate: Secondary | ICD-10-CM

## 2023-06-24 ENCOUNTER — Ambulatory Visit: Payer: Medicare PPO | Admitting: Urology

## 2023-06-24 VITALS — BP 147/84 | HR 70 | Ht 66.0 in | Wt 206.0 lb

## 2023-06-24 DIAGNOSIS — Z8546 Personal history of malignant neoplasm of prostate: Secondary | ICD-10-CM

## 2023-06-24 DIAGNOSIS — C61 Malignant neoplasm of prostate: Secondary | ICD-10-CM

## 2023-06-24 DIAGNOSIS — R350 Frequency of micturition: Secondary | ICD-10-CM | POA: Diagnosis not present

## 2023-06-24 LAB — PSA: Prostate Specific Ag, Serum: 0.2 ng/mL (ref 0.0–4.0)

## 2023-06-24 MED ORDER — TAMSULOSIN HCL 0.4 MG PO CAPS: 0.4000 mg | ORAL_CAPSULE | Freq: Every day | ORAL | 3 refills | Status: DC

## 2023-06-24 NOTE — Progress Notes (Addendum)
I,Shawn Melton,acting as a scribe for Shawn Scotland, MD.,have documented all relevant documentation on the behalf of Shawn Scotland, MD,as directed by  Shawn Scotland, MD while in the presence of Shawn Scotland, MD.  06/24/2023 9:09 AM   Shawn Melton 04/15/1956 [id number]  Referring provider: Gracelyn Nurse, MD [address] [place],  [place] [address]  Chief Complaint  Patient presents with   Follow-up    HPI: 67 year-old male with a personal history of prostate cancer and hypogonadism presents today for a follow-up.  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 He received his last DEPO on 06/19/2021.   His most recent PSA from 06/23/2023 is 0.2, which is stable from 6 months ago.   He continues to take Flomax for his urinary symptoms. His most recent testosterone from 12/09/2022 is normalized from 6 months previously.  He is doing well overall. No more hot flashes. His energy and stamina is returning.    PMH: Past Medical History:  Diagnosis Date   Basal cell carcinoma 09/21/2015   left infranasal on the sup philtrum   Basal cell carcinoma 02/26/2017   right of midline forehead   Basal cell carcinoma 08/02/2020   L cheek infra auricular    Chronic ankle pain    DJD (degenerative joint disease)    GERD (gastroesophageal reflux disease)    H/O endoscopy    History of stomach ulcers    Hx of colonoscopy    Hypertension    Melanoma (HCC) 10/10/2015   left upper back post base of neck, insitu/excision   Obesity    Prostate CA (HCC) 2020   Reflux    Skin cancer    Sleep apnea    using upper/lower mouth piece since 2016 ( not using CPAP)     Surgical History: Past Surgical History:  Procedure Laterality Date   ABSCESS DRAINAGE     COLONOSCOPY WITH PROPOFOL N/A 07/19/2022   Procedure:  COLONOSCOPY WITH PROPOFOL;  Surgeon: Regis Bill, MD;  Location: [facility];  Service: Endoscopy;  Laterality: N/A;   ESOPHAGOGASTRODUODENOSCOPY (EGD) WITH PROPOFOL N/A 07/19/2022   Procedure: ESOPHAGOGASTRODUODENOSCOPY (EGD) WITH PROPOFOL;  Surgeon: Regis Bill, MD;  Location: [facility];  Service: Endoscopy;  Laterality: N/A;   RADIOACTIVE SEED IMPLANT N/A 08/30/2019   Procedure: RADIOACTIVE SEED IMPLANT/BRACHYTHERAPY IMPLANT;  Surgeon: Shawn Scotland, MD;  Location: [facility];  Service: Urology;  Laterality: N/A;   ROBOTIC PELVIC AND PARA-AORTIC LYMPH NODE DISSECTION Left 05/31/2019   Procedure: XI ROBOTIC PELVIC lymph NODE DISSECTION;  Surgeon: Shawn Scotland, MD;  Location: [facility];  Service: Urology;  Laterality: Left;    Home Medications:  Allergies as of 06/24/2023       Reactions   Neosporin [bacitracin-polymyxin B] Itching, Other (See Comments)   redness        Medication List        Accurate as of June 24, 2023  9:09 AM. If you have any questions, ask your nurse or doctor.          acyclovir 400 MG tablet Commonly known as: ZOVIRAX TAKE 1 TABLET BY MOUTH THREE TIMES A DAY FOR 10 DAYS   aspirin EC 81 MG tablet Take by mouth.   fluticasone 50 MCG/ACT nasal spray Commonly known as: FLONASE Place 2 sprays into both nostrils daily as needed for  allergies or rhinitis.   ibuprofen 200 MG tablet Commonly known as: ADVIL Take 400 mg by mouth every 8 (eight) hours as needed for moderate pain.   losartan 50 MG tablet Commonly known as: COZAAR Take 50 mg by mouth at bedtime.   multivitamin with minerals Tabs tablet Take 1 tablet by mouth daily.   omeprazole 40 MG capsule Commonly known as: PRILOSEC Take 40 mg by mouth daily before breakfast.   sertraline 50 MG tablet Commonly known as: ZOLOFT Take 1 tablet by mouth daily.   tamsulosin 0.4 MG Caps capsule Commonly known as: FLOMAX Take 1 capsule (0.4 mg total) by mouth daily.         Allergies:  Allergies  Allergen Reactions   Neosporin [Bacitracin-Polymyxin B] Itching and Other (See Comments)    redness    Family History: Family History  Problem Relation Age of Onset   Diabetes Father    Stroke Father    Hypertension Mother    Colon cancer Paternal Uncle    Prostate cancer Cousin     Social History:  reports that he has never smoked. He has never used smokeless tobacco. He reports current alcohol use. He reports that he does not use drugs.   Physical Exam: BP (!) 147/84   Pulse 70   Ht 5\' 6"  (1.676 m)   Wt 206 lb (93.4 kg)   BMI 33.25 kg/m   Constitutional:  Alert and oriented, No acute distress. HEENT: [place] AT, moist mucus membranes.  Trachea midline, no masses. Neurologic: Grossly intact, no focal deficits, moving all 4 extremities. Psychiatric: Normal mood and affect.   Assessment & Plan:    History of prostate cancer  - His PSA is stable. Continue to check on a q6 month basis.  - Okay to discontinue taking the calcium supplement and maintain a healthy diet.  2. Urinary frequency  - Symptoms under control. Going to try stopping the Flomax and see if he stabilizes. If not, will start taking again. Refill sent to pharmacy for 1 year.    Return in about 1 year (around 06/23/2024) for PSA and visit. (Lab only for PSA in 6 mo)  I have reviewed the above documentation for accuracy and completeness, and I agree with the above.   Shawn Scotland, MD    [facility] [address], [address] [place], [place] [address] [phone]

## 2023-08-11 ENCOUNTER — Encounter: Payer: Self-pay | Admitting: Urology

## 2023-08-11 NOTE — Telephone Encounter (Signed)
Patient advised, virtual appointment set up.

## 2023-08-12 ENCOUNTER — Telehealth: Payer: Medicare PPO | Admitting: Urology

## 2023-08-12 DIAGNOSIS — N5235 Erectile dysfunction following radiation therapy: Secondary | ICD-10-CM | POA: Diagnosis not present

## 2023-08-12 MED ORDER — SILDENAFIL CITRATE 20 MG PO TABS: 20.0000 mg | ORAL_TABLET | ORAL | 11 refills | Status: DC | PRN

## 2023-08-12 NOTE — Progress Notes (Signed)
  I,[name],acting as a scribe for [name], MD.,have documented all relevant documentation on the behalf of [name], MD,as directed by  [name], MD while in the presence of [name], MD.  Virtual Visit via Video Note  I connected with Shawn Melton on 08/12/2023 at  1:30 PM EDT by a video enabled telemedicine application and verified that I am speaking with the correct person using two identifiers.  Location: Patient: Shawn Melton Provider: Dr. [name]   I discussed the limitations of evaluation and management by telemedicine and the availability of in person appointments. The patient expressed understanding and agreed to proceed.  History of Present Illness: 67 year-old male with a personal history of prostate cancer presents via virtual visit to discuss erectile dysfunction.   He sent a MyChart message indicating that after his radiation he began to experience a decrease in his erections and a 30% decrease in rigidity.  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 He ultimately underwent ADT plus EBRT. His testosterone normalized and his libido returned. His most recent testosterone was eight months ago at 534.  He reports mostly having difficulty getting an erection. He has never tried Viagra or Cialis. No personal history of heart attack or stroke.  He stopped taking Flomax in July and hasn't noticed a significant decline of his symptoms.   Observations/Objective: He appears well.   Assessment and Plan:  Erectile Dysfunction  - We discussed the pathophysiology of erectile dysfunction today along with possible contributing factors. Discussed possible treatment options including PDE 5 inhibitors, vacuum erectile device, intracavernosal injection, MUSE, and placement of the inflatable or malleable penile prosthesis for refractory cases.  - In terms of PDE 5 inhibitors, we discussed contraindications for  this medication as well as common side effects including headaches and muscle aches. Patient was counseled on optimal use including taking on an empty stomach and timing of when taken. All of his questions were answered in detail.  - Plan to try Sildenafil first. Sent to pharmacy. He will let us know how it is working. If it isn't, plan to try Cialis.  Follow Up Instructions: As scheduled   I discussed the assessment and treatment plan with the patient. The patient was provided an opportunity to ask questions and all were answered. The patient agreed with the plan and demonstrated an understanding of the instructions.   The patient was advised to call back or seek an in-person evaluation if the symptoms worsen or if the condition fails to improve as anticipated.  I provided 12 minutes of non-face-to-face time during this encounter.  I have reviewed the above documentation for accuracy and completeness, and I agree with the above.   [name], MD

## 2023-12-05 ENCOUNTER — Encounter: Payer: Self-pay | Admitting: Urology

## 2023-12-09 MED ORDER — TADALAFIL 20 MG PO TABS: 20.0000 mg | ORAL_TABLET | Freq: Every day | ORAL | 0 refills | Status: DC | PRN

## 2023-12-24 ENCOUNTER — Other Ambulatory Visit: Payer: Self-pay

## 2023-12-24 DIAGNOSIS — C61 Malignant neoplasm of prostate: Secondary | ICD-10-CM

## 2023-12-25 ENCOUNTER — Other Ambulatory Visit: Payer: Medicare PPO

## 2023-12-25 DIAGNOSIS — C61 Malignant neoplasm of prostate: Secondary | ICD-10-CM

## 2023-12-26 LAB — PSA: Prostate Specific Ag, Serum: <0.1 ng/mL (ref 0.0–4.0)

## 2024-04-14 ENCOUNTER — Ambulatory Visit: Payer: Medicare PPO | Admitting: Dermatology

## 2024-04-14 ENCOUNTER — Encounter: Payer: Self-pay | Admitting: Dermatology

## 2024-04-14 DIAGNOSIS — L570 Actinic keratosis: Secondary | ICD-10-CM | POA: Diagnosis not present

## 2024-04-14 DIAGNOSIS — L918 Other hypertrophic disorders of the skin: Secondary | ICD-10-CM | POA: Diagnosis not present

## 2024-04-14 DIAGNOSIS — I781 Nevus, non-neoplastic: Secondary | ICD-10-CM

## 2024-04-14 DIAGNOSIS — L820 Inflamed seborrheic keratosis: Secondary | ICD-10-CM

## 2024-04-14 DIAGNOSIS — Z79899 Other long term (current) drug therapy: Secondary | ICD-10-CM

## 2024-04-14 DIAGNOSIS — Z85820 Personal history of malignant melanoma of skin: Secondary | ICD-10-CM

## 2024-04-14 DIAGNOSIS — D229 Melanocytic nevi, unspecified: Secondary | ICD-10-CM

## 2024-04-14 DIAGNOSIS — B009 Herpesviral infection, unspecified: Secondary | ICD-10-CM

## 2024-04-14 DIAGNOSIS — D1801 Hemangioma of skin and subcutaneous tissue: Secondary | ICD-10-CM

## 2024-04-14 DIAGNOSIS — Z1283 Encounter for screening for malignant neoplasm of skin: Secondary | ICD-10-CM

## 2024-04-14 DIAGNOSIS — L578 Other skin changes due to chronic exposure to nonionizing radiation: Secondary | ICD-10-CM

## 2024-04-14 DIAGNOSIS — W908XXA Exposure to other nonionizing radiation, initial encounter: Secondary | ICD-10-CM

## 2024-04-14 DIAGNOSIS — L814 Other melanin hyperpigmentation: Secondary | ICD-10-CM

## 2024-04-14 DIAGNOSIS — Z8619 Personal history of other infectious and parasitic diseases: Secondary | ICD-10-CM

## 2024-04-14 DIAGNOSIS — L821 Other seborrheic keratosis: Secondary | ICD-10-CM

## 2024-04-14 DIAGNOSIS — Z85828 Personal history of other malignant neoplasm of skin: Secondary | ICD-10-CM

## 2024-04-14 DIAGNOSIS — Z7189 Other specified counseling: Secondary | ICD-10-CM

## 2024-04-14 DIAGNOSIS — Z5111 Encounter for antineoplastic chemotherapy: Secondary | ICD-10-CM

## 2024-04-14 MED ORDER — ACYCLOVIR 400 MG PO TABS: ORAL_TABLET | ORAL | 1 refills | Status: AC

## 2024-04-14 NOTE — Patient Instructions (Addendum)
 Start cream after July 4 - Start 5-fluorouracil/calcipotriene cream twice a day for 10 days to affected areas including top of scalp. Prescription sent to [facility]. Patient advised they will receive an email to purchase the medication online and have it sent to their home. Patient provided with handout reviewing treatment course and side effects and advised to call or message us  on MyChart with any concerns.  Reviewed course of treatment and expected reaction.  Patient advised to expect inflammation and crusting and advised that erosions are possible.  Patient advised to be diligent with sun protection during and after treatment. Counseled to keep medication out of reach of children and pets.     Instructions for [facility]  One or more of your medications was sent to the [facility]. You will receive an email from them and can purchase the medicine through that link. It will then be mailed to your home at the address you confirmed. If for any reason you do not receive an email from them, please check your spam folder. If you still do not find the email, please let us  know. [facility] phone number is [phone].    5-Fluorouracil/Calcipotriene Patient Education   Actinic keratoses are the dry, red scaly spots on the skin caused by sun damage. A portion of these spots can turn into skin cancer with time, and treating them can help prevent development of skin cancer.   Treatment of these spots requires removal of the defective skin cells. There are various ways to remove actinic keratoses, including freezing with liquid nitrogen, treatment with creams, or treatment with a blue light procedure in the office.   5-fluorouracil cream is a topical cream used to treat actinic keratoses. It works by interfering with the growth of abnormal fast-growing skin cells, such as actinic keratoses. These cells peel off and are  replaced by healthy ones.   5-fluorouracil/calcipotriene is a combination of the 5-fluorouracil cream with a vitamin D  analog cream called calcipotriene. The calcipotriene alone does not treat actinic keratoses. However, when it is combined with 5-fluorouracil, it helps the 5-fluorouracil treat the actinic keratoses much faster so that the same results can be achieved with a much shorter treatment time.  INSTRUCTIONS FOR 5-FLUOROURACIL/CALCIPOTRIENE CREAM:   5-fluorouracil/calcipotriene cream typically only needs to be used for 4-7 days. A thin layer should be applied twice a day to the treatment areas recommended by your physician.   If your physician prescribed you separate tubes of 5-fluourouracil and calcipotriene, apply a thin layer of 5-fluorouracil followed by a thin layer of calcipotriene.   Avoid contact with your eyes, nostrils, and mouth. Do not use 5-fluorouracil/calcipotriene cream on infected or open wounds.   You will develop redness, irritation and some crusting at areas where you have pre-cancer damage/actinic keratoses. IF YOU DEVELOP PAIN, BLEEDING, OR SIGNIFICANT CRUSTING, STOP THE TREATMENT EARLY - you have already gotten a good response and the actinic keratoses should clear up well.  Wash your hands after applying 5-fluorouracil 5% cream on your skin.   A moisturizer or sunscreen with a minimum SPF 30 should be applied each morning.   Once you have finished the treatment, you can apply a thin layer of Vaseline twice a day to irritated areas to soothe and calm the areas more quickly. If you experience significant discomfort, contact your physician.  For some patients it is necessary to repeat the treatment for best results.  SIDE EFFECTS: When using 5-fluorouracil/calcipotriene cream,  you may have mild irritation, such as redness, dryness, swelling, or a mild burning sensation. This usually resolves within 2 weeks. The more actinic keratoses you have, the more redness and  inflammation you can expect during treatment. Eye irritation has been reported rarely. If this occurs, please let us  know.  If you have any trouble using this cream, please call the office. If you have any other questions about this information, please do not hesitate to ask me before you leave the office.    Actinic keratoses are precancerous spots that appear secondary to cumulative UV radiation exposure/sun exposure over time. They are chronic with expected duration over 1 year. A portion of actinic keratoses will progress to squamous cell carcinoma of the skin. It is not possible to reliably predict which spots will progress to skin cancer and so treatment is recommended to prevent development of skin cancer.  Recommend daily broad spectrum sunscreen SPF 30+ to sun-exposed areas, reapply every 2 hours as needed.  Recommend staying in the shade or wearing long sleeves, sun glasses (UVA+UVB protection) and wide brim hats (4-inch brim around the entire circumference of the hat). Call for new or changing lesions.   Cryotherapy Aftercare  Wash gently with soap and water everyday.   Apply Vaseline and Band-Aid daily until healed.   Seborrheic Keratosis  What causes seborrheic keratoses? Seborrheic keratoses are harmless, common skin growths that first appear during adult life.  As time goes by, more growths appear.  Some people may develop a large number of them.  Seborrheic keratoses appear on both covered and uncovered body parts.  They are not caused by sunlight.  The tendency to develop seborrheic keratoses can be inherited.  They vary in color from skin-colored to gray, brown, or even black.  They can be either smooth or have a rough, warty surface.   Seborrheic keratoses are superficial and look as if they were stuck on the skin.  Under the microscope this type of keratosis looks like layers upon layers of skin.  That is why at times the top layer may seem to fall off, but the rest of the  growth remains and re-grows.    Treatment Seborrheic keratoses do not need to be treated, but can easily be removed in the office.  Seborrheic keratoses often cause symptoms when they rub on clothing or jewelry.  Lesions can be in the way of shaving.  If they become inflamed, they can cause itching, soreness, or burning.  Removal of a seborrheic keratosis can be accomplished by freezing, burning, or surgery. If any spot bleeds, scabs, or grows rapidly, please return to have it checked, as these can be an indication of a skin cancer.   Melanoma ABCDEs  Melanoma is the most dangerous type of skin cancer, and is the leading cause of death from skin disease.  You are more likely to develop melanoma if you: Have light-colored skin, light-colored eyes, or red or blond hair Spend a lot of time in the sun Tan regularly, either outdoors or in a tanning bed Have had blistering sunburns, especially during childhood Have a close family member who has had a melanoma Have atypical moles or large birthmarks  Early detection of melanoma is key since treatment is typically straightforward and cure rates are extremely high if we catch it early.   The first sign of melanoma is often a change in a mole or a new dark spot.  The ABCDE system is a way of remembering the signs  of melanoma.  A for asymmetry:  The two halves do not match. B for border:  The edges of the growth are irregular. C for color:  A mixture of colors are present instead of an even brown color. D for diameter:  Melanomas are usually (but not always) greater than 6mm - the size of a pencil eraser. E for evolution:  The spot keeps changing in size, shape, and color.  Please check your skin once per month between visits. You can use a small mirror in front and a large mirror behind you to keep an eye on the back side or your body.   If you see any new or changing lesions before your next follow-up, please call to schedule a visit.  Please  continue daily skin protection including broad spectrum sunscreen SPF 30+ to sun-exposed areas, reapplying every 2 hours as needed when you're outdoors.   Staying in the shade or wearing long sleeves, sun glasses (UVA+UVB protection) and wide brim hats (4-inch brim around the entire circumference of the hat) are also recommended for sun protection.    Due to recent changes in healthcare laws, you may see results of your pathology and/or laboratory studies on MyChart before the doctors have had a chance to review them. We understand that in some cases there may be results that are confusing or concerning to you. Please understand that not all results are received at the same time and often the doctors may need to interpret multiple results in order to provide you with the best plan of care or course of treatment. Therefore, we ask that you please give us  2 business days to thoroughly review all your results before contacting the office for clarification. Should we see a critical lab result, you will be contacted sooner.   If You Need Anything After Your Visit  If you have any questions or concerns for your doctor, please call our main line at [phone] and press option 4 to reach your doctor's medical assistant. If no one answers, please leave a voicemail as directed and we will return your call as soon as possible. Messages left after 4 pm will be answered the following business day.   You may also send us  a message via MyChart. We typically respond to MyChart messages within 1-2 business days.  For prescription refills, please ask your pharmacy to contact our office. Our fax number is [phone].  If you have an urgent issue when the clinic is closed that cannot wait until the next business day, you can page your doctor at the number below.    Please note that while we do our best to be available for urgent issues outside of office hours, we are not available 24/7.   If you have an urgent  issue and are unable to reach us , you may choose to seek medical care at your doctor's office, retail clinic, urgent care center, or emergency room.  If you have a medical emergency, please immediately call 911 or go to the emergency department.  Pager Numbers  - Dr. Bary Likes: [phone]  - Dr. Annette Barters: [phone]  - Dr. Felipe Horton: [phone]   In the event of inclement weather, please call our main line at [phone] for an update on the status of any delays or closures.  Dermatology Medication Tips: Please keep the boxes that topical medications come in in order to help keep track of the instructions about where and how to use these. Pharmacies typically print the medication instructions only  on the boxes and not directly on the medication tubes.   If your medication is too expensive, please contact our office at [phone] option 4 or send us  a message through MyChart.   We are unable to tell what your co-pay for medications will be in advance as this is different depending on your insurance coverage. However, we may be able to find a substitute medication at lower cost or fill out paperwork to get insurance to cover a needed medication.   If a prior authorization is required to get your medication covered by your insurance company, please allow us  1-2 business days to complete this process.  Drug prices often vary depending on where the prescription is filled and some pharmacies may offer cheaper prices.  The website www.goodrx.com contains coupons for medications through different pharmacies. The prices here do not account for what the cost may be with help from insurance (it may be cheaper with your insurance), but the website can give you the price if you did not use any insurance.  - You can print the associated coupon and take it with your prescription to the pharmacy.  - You may also stop by our office during regular business hours and pick up a GoodRx coupon card.  - If  you need your prescription sent electronically to a different pharmacy, notify our office through [facility] or by phone at [phone] option 4.     Si Usted Necesita Algo Despus de Su Visita  Tambin puede enviarnos un mensaje a travs de Clinical cytogeneticist. Por lo general respondemos a los mensajes de MyChart en el transcurso de 1 a 2 das hbiles.  Para renovar recetas, por favor pida a su farmacia que se ponga en contacto con nuestra oficina. Franz Jacks de fax es [place] [phone].  Si tiene un asunto urgente cuando la clnica est cerrada y que no puede esperar hasta el siguiente da hbil, puede llamar/localizar a su doctor(a) al nmero que aparece a continuacin.   Por favor, tenga en cuenta que aunque hacemos todo lo posible para estar disponibles para asuntos urgentes fuera del horario de [place], no estamos disponibles las 24 horas del da, los 7 [address]  [address] de la [place].   Si tiene un problema urgente y no puede comunicarse con nosotros, puede optar por buscar atencin mdica  en el consultorio de su doctor(a), en una clnica privada, en un centro de atencin urgente o en una sala de emergencias.  Si tiene Engineer, drilling, por favor llame inmediatamente al 911 o vaya a la sala de emergencias.  Nmeros de bper  - Dr. Bary Likes: [phone]  - Dra. Annette Barters: [phone]  - Dr. Felipe Horton: [phone]   En caso de inclemencias del tiempo, por favor llame a Lajuan Pila principal al [phone] para una actualizacin sobre el [place] de cualquier retraso o cierre.  Consejos para la medicacin en dermatologa: Por favor, guarde las cajas en las que vienen los medicamentos de uso tpico para ayudarle a seguir las instrucciones sobre dnde y cmo usarlos. Las farmacias generalmente imprimen las instrucciones del medicamento slo en las cajas y no directamente en los tubos del [place].   Si su medicamento es muy caro, por favor, pngase en contacto con Bettyjane Brunet llamando  al [phone] y presione la opcin 4 o envenos un mensaje a travs de Clinical cytogeneticist.   No podemos decirle cul ser su copago por los medicamentos por adelantado ya que esto es diferente dependiendo de la cobertura de su seguro. Sin embargo, es  posible que podamos encontrar un medicamento sustituto a Audiological scientist un formulario para que el seguro cubra el medicamento que se considera necesario.   Si se requiere una autorizacin previa para que su compaa de seguros [place] su medicamento, por favor permtanos de 1 a 2 das hbiles para completar este proceso.  Los precios de los medicamentos varan con frecuencia dependiendo del Environmental consultant de dnde se surte la receta y alguna farmacias pueden ofrecer precios ms baratos.  El sitio web www.goodrx.com tiene cupones para medicamentos de Health and safety inspector. Los precios aqu no tienen en cuenta lo que podra costar con la ayuda del seguro (puede ser ms barato con su seguro), pero el sitio web puede darle el precio si no utiliz Tourist information centre manager.  - Puede imprimir el cupn correspondiente y llevarlo con su receta a la farmacia.  - Tambin puede pasar por nuestra oficina durante el horario de atencin regular y Education officer, museum una tarjeta de cupones de GoodRx.  - Si necesita que su receta se enve electrnicamente a una farmacia diferente, informe a nuestra oficina a travs de MyChart de [place] o por telfono llamando al [phone] y presione la opcin 4.

## 2024-04-14 NOTE — Progress Notes (Signed)
 Follow-Up Visit   Subjective  Shawn Melton is a 68 y.o. male who presents for the following: Skin Cancer Screening and Full Body Skin Exam hx of melanoma insitu, hx of bcc multiple, hx of aks, hx of isks Spot right cheek crusty, some crusty areas at scalp Hx of cold sores refills of acyclovir    The patient presents for Total-Body Skin Exam (TBSE) for skin cancer screening and mole check. The patient has spots, moles and lesions to be evaluated, some may be new or changing and the patient may have concern these could be cancer.  The following portions of the chart were reviewed this encounter and updated as appropriate: medications, allergies, medical history  Review of Systems:  No other skin or systemic complaints except as noted in HPI or Assessment and Plan.  Objective  Well appearing patient in no apparent distress; mood and affect are within normal limits.  A full examination was performed including scalp, head, eyes, ears, nose, lips, neck, chest, axillae, abdomen, back, buttocks, bilateral upper extremities, bilateral lower extremities, hands, feet, fingers, toes, fingernails, and toenails. All findings within normal limits unless otherwise noted below.   Relevant physical exam findings are noted in the Assessment and Plan.  right upper arm x 1, right forearm x 1, right cheek x 1, above left brow x 1 (4) Erythematous stuck-on, waxy papule or plaque left forehead x 2, scalp x 10 (12) Erythematous thin papules/macules with gritty scale.   Assessment & Plan   SKIN CANCER SCREENING PERFORMED TODAY.  ACTINIC DAMAGE - Chronic condition, secondary to cumulative UV/sun exposure - diffuse scaly erythematous macules with underlying dyspigmentation - Recommend daily broad spectrum sunscreen SPF 30+ to sun-exposed areas, reapply every 2 hours as needed.  - Staying in the shade or wearing long sleeves, sun glasses (UVA+UVB protection) and wide brim hats (4-inch brim around the entire  circumference of the hat) are also recommended for sun protection.  - Call for new or changing lesions.  Acrochordons (Skin Tags) - Fleshy, skin-colored pedunculated papules - Benign appearing.  - Observe. - If desired, they can be removed with an in office procedure that is not covered by insurance. - Please call the clinic if you notice any new or changing lesions.  TELANGIECTASIA Exam: dilated blood vessel above left medial brow Treatment Plan: Benign appearing on exam Call for changes  LENTIGINES, SEBORRHEIC KERATOSES, HEMANGIOMAS - Benign normal skin lesions - Benign-appearing - Call for any changes  MELANOCYTIC NEVI - Tan-brown and/or pink-flesh-colored symmetric macules and papules - Benign appearing on exam today - Observation - Call clinic for new or changing moles - Recommend daily use of broad spectrum spf 30+ sunscreen to sun-exposed areas.   HISTORY OF MELANOMA INSITU  10/09/2017 - Left upper back post of base of neck - excision  (see copy of previous pathology scanned in from 10/10/2015) - No evidence of recurrence today - No lymphadenopathy - Recommend regular full body skin exams - Recommend daily broad spectrum sunscreen SPF 30+ to sun-exposed areas, reapply every 2 hours as needed.  - Call if any new or changing lesions are noted between office visits   HISTORY OF BASAL CELL CARCINOMA OF THE SKIN 09/21/2015 - left infranasal on the superior philtrum 02/26/2017 - right of midline forehead 08/02/2020 - left cheek infraauricular - infiltrative patterns - ED&C - No evidence of recurrence today - Recommend regular full body skin exams - Recommend daily broad spectrum sunscreen SPF 30+ to sun-exposed areas, reapply every 2  hours as needed.  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  It is infectious and can be spread from person to person by direct contact.  It is not curable, but is treatable with topical and oral medication. Treatment Plan Take Valacyclovir 400 mg tid for active fever blister for 7-10 days or 1 po every day for prevention  INFLAMED SEBORRHEIC KERATOSIS (4) right upper arm x 1, right forearm x 1, right cheek x 1, above left brow x 1 (4) Symptomatic, irritating, patient would like treated. Destruction of lesion - right upper arm x 1, right forearm x 1, right cheek x 1, above left brow x 1 (4) Complexity: simple   Destruction method: cryotherapy   Informed consent: discussed and consent obtained   Timeout:  patient name, date of birth, surgical site, and procedure verified Lesion destroyed using liquid nitrogen: Yes   Region frozen until ice ball extended beyond lesion: Yes   Outcome: patient tolerated procedure well with no complications   Post-procedure details: wound care instructions given   HERPESVIRAL INFECTION, UNSPECIFIED   Related Medications acyclovir  (ZOVIRAX ) 400 MG tablet TAKE 1 TABLET BY MOUTH THREE TIMES A DAY FOR 10 DAYS ACTINIC KERATOSIS (12) left forehead x 2, scalp x 10 (12) Start cream after July 4  - Start 5-fluorouracil/calcipotriene cream twice a day for 10 days to affected areas including top of scalp. Prescription sent to [facility]. Patient advised they will receive an email to purchase the medication online and have it sent to their home. Patient provided with handout reviewing treatment course and side effects and advised to call or message us  on MyChart with any concerns. Reviewed course of treatment and expected reaction.  Patient advised to expect inflammation and crusting and advised that erosions are possible.  Patient advised to be diligent with sun protection during and  after treatment. Counseled to keep medication out of reach of children and pets.   ACTINIC DAMAGE WITH PRECANCEROUS ACTINIC KERATOSES Counseling for Topical Chemotherapy Management: Patient exhibits: - Severe, confluent actinic changes with pre-cancerous actinic keratoses that is secondary to cumulative UV radiation exposure over time - Condition that is severe; chronic, not at goal. - diffuse scaly erythematous macules and papules with underlying dyspigmentation - Discussed Prescription "Field Treatment" topical Chemotherapy for Severe, Chronic Confluent Actinic Changes with Pre-Cancerous Actinic Keratoses Field treatment involves treatment of an entire area of skin that has confluent Actinic Changes (Sun/ Ultraviolet light damage) and PreCancerous Actinic Keratoses by method of PhotoDynamic Therapy (PDT) and/or prescription Topical Chemotherapy agents such as 5-fluorouracil, 5-fluorouracil/calcipotriene, and/or imiquimod.  The purpose is to decrease the number of clinically evident and subclinical PreCancerous lesions to prevent progression to development of skin cancer by chemically destroying early precancer changes that may or may not be visible.  It has been shown to reduce the risk of developing skin cancer in the treated area. As a result of treatment, redness, scaling, crusting, and open sores may occur during treatment course. One or more than one of these methods may be used and may have to be used several times to control, suppress and eliminate the PreCancerous changes. Discussed treatment course, expected reaction, and possible side effects. - Recommend daily  broad spectrum sunscreen SPF 30+ to sun-exposed areas, reapply every 2 hours as needed.  - Staying in the shade or wearing long sleeves, sun glasses (UVA+UVB protection) and wide brim hats (4-inch brim around the entire circumference of the hat) are also recommended. - Call for new or changing lesions.   Destruction of lesion -  left forehead x 2, scalp x 10 (12) Complexity: simple   Destruction method: cryotherapy   Informed consent: discussed and consent obtained   Timeout:  patient name, date of birth, surgical site, and procedure verified Lesion destroyed using liquid nitrogen: Yes   Region frozen until ice ball extended beyond lesion: Yes   Outcome: patient tolerated procedure well with no complications   Post-procedure details: wound care instructions given   Return in about 1 year (around 04/14/2025) for TBSE.  IRandee Busing, CMA, am acting as scribe for Celine Collard, MD.   Documentation: I have reviewed the above documentation for accuracy and completeness, and I agree with the above.  Celine Collard, MD

## 2024-06-18 ENCOUNTER — Other Ambulatory Visit

## 2024-06-18 ENCOUNTER — Other Ambulatory Visit: Payer: Self-pay

## 2024-06-18 DIAGNOSIS — C61 Malignant neoplasm of prostate: Secondary | ICD-10-CM

## 2024-06-18 DIAGNOSIS — N5235 Erectile dysfunction following radiation therapy: Secondary | ICD-10-CM

## 2024-06-19 LAB — PSA: Prostate Specific Ag, Serum: <0.1 ng/mL (ref 0.0–4.0)

## 2024-06-21 ENCOUNTER — Other Ambulatory Visit: Payer: Self-pay

## 2024-06-21 ENCOUNTER — Ambulatory Visit: Admitting: Urology

## 2024-06-21 ENCOUNTER — Encounter: Payer: Self-pay | Admitting: Urology

## 2024-06-21 VITALS — BP 174/79 | HR 90 | Ht 66.0 in | Wt 205.0 lb

## 2024-06-21 DIAGNOSIS — N5235 Erectile dysfunction following radiation therapy: Secondary | ICD-10-CM | POA: Diagnosis not present

## 2024-06-21 DIAGNOSIS — C61 Malignant neoplasm of prostate: Secondary | ICD-10-CM

## 2024-06-21 NOTE — Progress Notes (Unsigned)
 06/21/2024 8:27 AM   Shawn Melton 1956/08/11 [id number]  Referring provider: Rudolpho Norleen BIRCH, MD [address] [facility] [address] [facility] [place],  [place] [address]  Chief Complaint  Patient presents with   Prostate Cancer   Urologic history: 1.  Prostate cancer  2.  Erectile dysfunction  HPI: Shawn Melton is a 68 y.o. male who presents for annual follow-up.  Last visit was a video visit with Dr. Penne September 2024.  Sildenafil  had not been effective and he was switched to tadalafil  20 mg without significant improvement Second line options had been discussed.  He may be interested in intracavernosal injections but is not yet ready to proceed PSA 06/18/2024 was < 0.1 No bothersome LUTS   PMH: Past Medical History:  Diagnosis Date   Basal cell carcinoma 09/21/2015   left infranasal on the sup philtrum   Basal cell carcinoma 02/26/2017   right of midline forehead   Basal cell carcinoma 08/02/2020   L cheek infra auricular    Chronic ankle pain    DJD (degenerative joint disease)    GERD (gastroesophageal reflux disease)    H/O endoscopy    History of stomach ulcers    Hx of colonoscopy    Hypertension    Melanoma (HCC) 10/10/2015   left upper back post base of neck, insitu/excision   Obesity    Prostate CA (HCC) 2020   Reflux    Skin cancer    Sleep apnea    using upper/lower mouth piece since 2016 ( not using CPAP)     Surgical History: Past Surgical History:  Procedure Laterality Date   ABSCESS DRAINAGE     COLONOSCOPY WITH PROPOFOL  N/A 07/19/2022   Procedure: COLONOSCOPY WITH PROPOFOL ;  Surgeon: Maryruth Ole DASEN, MD;  Location: [facility];  Service: Endoscopy;  Laterality: N/A;   ESOPHAGOGASTRODUODENOSCOPY (EGD) WITH PROPOFOL  N/A 07/19/2022   Procedure: ESOPHAGOGASTRODUODENOSCOPY (EGD) WITH PROPOFOL ;  Surgeon: Maryruth Ole DASEN, MD;  Location: [facility];  Service: Endoscopy;  Laterality: N/A;   RADIOACTIVE SEED IMPLANT N/A 08/30/2019    Procedure: RADIOACTIVE SEED IMPLANT/BRACHYTHERAPY IMPLANT;  Surgeon: Penne Knee, MD;  Location: [facility];  Service: Urology;  Laterality: N/A;   ROBOTIC PELVIC AND PARA-AORTIC LYMPH NODE DISSECTION Left 05/31/2019   Procedure: XI ROBOTIC PELVIC lymph NODE DISSECTION;  Surgeon: Penne Knee, MD;  Location: [facility];  Service: Urology;  Laterality: Left;    Home Medications:  Allergies as of 06/21/2024       Reactions   Neosporin [bacitracin -polymyxin B] Itching, Other (See Comments)   redness        Medication List        Accurate as of June 21, 2024  8:27 AM. If you have any questions, ask your nurse or doctor.          STOP taking these medications    sildenafil  20 MG tablet Commonly known as: Revatio  Stopped by: Glendia JAYSON Barba   tadalafil  20 MG tablet Commonly known as: CIALIS  Stopped by: Glendia JAYSON Barba       TAKE these medications    acyclovir  400 MG tablet Commonly known as: ZOVIRAX  TAKE 1 TABLET BY MOUTH THREE TIMES A DAY FOR 10 DAYS   losartan  50 MG tablet Commonly known as: COZAAR  Take 50 mg by mouth at bedtime.   multivitamin with minerals Tabs tablet Take 1 tablet by mouth daily.   omeprazole 40 MG capsule Commonly known as: PRILOSEC Take 40 mg by mouth daily before breakfast.  Allergies:  Allergies  Allergen Reactions   Neosporin [Bacitracin -Polymyxin B] Itching and Other (See Comments)    redness    Family History: Family History  Problem Relation Age of Onset   Diabetes Father    Stroke Father    Hypertension Mother    Colon cancer Paternal Uncle    Prostate cancer Cousin     Social History:  reports that he has never smoked. He has never used smokeless tobacco. He reports current alcohol use. He reports that he does not use drugs.   Physical Exam: There were no vitals taken for this visit.  Constitutional:  Alert and oriented, No acute distress. HEENT: [place] AT Respiratory: Normal respiratory effort, no  increased work of breathing. Psychiatric: Normal mood and affect.   Assessment & Plan:    1.  Prostate cancer PSA remains undetectable Lab visit 57-month PSA Follow-up office visit 1 year with PSA  2.  Erectile dysfunction Discussed intracavernosal injections with potential side effects of priapism and corporal scarring We discussed use of compounding pharmacy and injection training by our PA He will call back if interested in pursuing   Glendia JAYSON Barba, MD  [facility] [address], [address] [place], [place] [address] [phone]

## 2024-06-22 ENCOUNTER — Encounter: Payer: Self-pay | Admitting: Urology

## 2024-06-23 ENCOUNTER — Ambulatory Visit: Payer: Self-pay | Admitting: Urology

## 2024-07-16 ENCOUNTER — Encounter: Payer: Self-pay | Admitting: Dermatology

## 2024-07-23 ENCOUNTER — Ambulatory Visit: Admitting: Urology

## 2024-12-22 ENCOUNTER — Other Ambulatory Visit

## 2024-12-22 DIAGNOSIS — C61 Malignant neoplasm of prostate: Secondary | ICD-10-CM

## 2024-12-23 ENCOUNTER — Ambulatory Visit: Payer: Self-pay | Admitting: Urology

## 2024-12-23 LAB — PSA: Prostate Specific Ag, Serum: <0.1 ng/mL (ref 0.0–4.0)

## 2025-04-04 ENCOUNTER — Ambulatory Visit: Admitting: Dermatology

## 2025-04-20 ENCOUNTER — Ambulatory Visit: Admitting: Dermatology

## 2025-04-28 ENCOUNTER — Ambulatory Visit: Admitting: Dermatology

## 2025-06-21 ENCOUNTER — Other Ambulatory Visit

## 2025-07-01 ENCOUNTER — Ambulatory Visit: Admitting: Urology

## 2025-07-22 ENCOUNTER — Ambulatory Visit: Admitting: Urology
